# Patient Record
Sex: Female | Born: 1955 | Race: Black or African American | Hispanic: No | Marital: Single
Health system: Southern US, Community
[De-identification: ages and names within clinical notes are randomized; demographics above are authoritative.]

## PROBLEM LIST (undated history)

## (undated) DIAGNOSIS — H43391 Other vitreous opacities, right eye: Secondary | ICD-10-CM

## (undated) HISTORY — PX: BREAST EXCISIONAL BIOPSY: SUR124

---

## 2000-09-02 ENCOUNTER — Emergency Department (HOSPITAL_COMMUNITY): Admission: EM | Admit: 2000-09-02 | Discharge: 2000-09-02 | Payer: Self-pay | Admitting: Emergency Medicine

## 2003-03-20 ENCOUNTER — Emergency Department (HOSPITAL_COMMUNITY): Admission: EM | Admit: 2003-03-20 | Discharge: 2003-03-20 | Payer: Self-pay | Admitting: *Deleted

## 2004-01-30 ENCOUNTER — Emergency Department (HOSPITAL_COMMUNITY): Admission: AD | Admit: 2004-01-30 | Discharge: 2004-01-30 | Payer: Self-pay | Admitting: Family Medicine

## 2005-05-27 ENCOUNTER — Encounter: Admission: RE | Admit: 2005-05-27 | Discharge: 2005-05-27 | Payer: Self-pay | Admitting: Obstetrics and Gynecology

## 2006-05-28 ENCOUNTER — Encounter: Admission: RE | Admit: 2006-05-28 | Discharge: 2006-05-28 | Payer: Self-pay | Admitting: Obstetrics and Gynecology

## 2007-05-31 ENCOUNTER — Encounter: Admission: RE | Admit: 2007-05-31 | Discharge: 2007-05-31 | Payer: Self-pay | Admitting: Obstetrics and Gynecology

## 2008-08-09 ENCOUNTER — Encounter: Admission: RE | Admit: 2008-08-09 | Discharge: 2008-08-09 | Payer: Self-pay | Admitting: Obstetrics and Gynecology

## 2009-09-11 ENCOUNTER — Encounter: Admission: RE | Admit: 2009-09-11 | Discharge: 2009-09-11 | Payer: Self-pay | Admitting: Obstetrics and Gynecology

## 2012-05-11 ENCOUNTER — Other Ambulatory Visit: Payer: Self-pay | Admitting: Family Medicine

## 2012-05-11 ENCOUNTER — Other Ambulatory Visit (HOSPITAL_COMMUNITY)
Admission: RE | Admit: 2012-05-11 | Discharge: 2012-05-11 | Disposition: A | Discharge status: Home / Self Care | Payer: BC Managed Care – PPO | Source: Ambulatory Visit | Attending: Family Medicine | Admitting: Family Medicine

## 2012-05-11 DIAGNOSIS — Z1231 Encounter for screening mammogram for malignant neoplasm of breast: Secondary | ICD-10-CM

## 2012-05-11 DIAGNOSIS — Z0000 Encounter for general adult medical examination without abnormal findings: Secondary | ICD-10-CM | POA: Insufficient documentation

## 2012-05-11 DIAGNOSIS — Z780 Asymptomatic menopausal state: Secondary | ICD-10-CM

## 2012-06-21 ENCOUNTER — Ambulatory Visit
Admission: RE | Admit: 2012-06-21 | Discharge: 2012-06-21 | Disposition: A | Discharge status: Home / Self Care | Payer: BC Managed Care – PPO | Source: Ambulatory Visit | Attending: Family Medicine | Admitting: Family Medicine

## 2012-06-21 DIAGNOSIS — Z1231 Encounter for screening mammogram for malignant neoplasm of breast: Secondary | ICD-10-CM

## 2013-08-18 ENCOUNTER — Other Ambulatory Visit: Payer: Self-pay | Admitting: Family Medicine

## 2013-08-18 DIAGNOSIS — Z1231 Encounter for screening mammogram for malignant neoplasm of breast: Secondary | ICD-10-CM

## 2013-09-06 ENCOUNTER — Ambulatory Visit
Admission: RE | Admit: 2013-09-06 | Discharge: 2013-09-06 | Disposition: A | Discharge status: Home / Self Care | Payer: BC Managed Care – PPO | Source: Ambulatory Visit | Attending: Family Medicine | Admitting: Family Medicine

## 2013-09-06 DIAGNOSIS — Z1231 Encounter for screening mammogram for malignant neoplasm of breast: Secondary | ICD-10-CM

## 2014-08-21 ENCOUNTER — Other Ambulatory Visit: Payer: Self-pay

## 2014-08-21 DIAGNOSIS — Z1231 Encounter for screening mammogram for malignant neoplasm of breast: Secondary | ICD-10-CM

## 2014-09-12 ENCOUNTER — Ambulatory Visit
Admission: RE | Admit: 2014-09-12 | Discharge: 2014-09-12 | Disposition: A | Discharge status: Home / Self Care | Payer: BC Managed Care – PPO | Source: Ambulatory Visit

## 2014-09-12 DIAGNOSIS — Z1231 Encounter for screening mammogram for malignant neoplasm of breast: Secondary | ICD-10-CM

## 2015-11-19 ENCOUNTER — Other Ambulatory Visit: Payer: Self-pay | Admitting: Family Medicine

## 2015-11-19 ENCOUNTER — Other Ambulatory Visit (HOSPITAL_COMMUNITY)
Admission: RE | Admit: 2015-11-19 | Discharge: 2015-11-19 | Disposition: A | Discharge status: Home / Self Care | Payer: BC Managed Care – PPO | Source: Ambulatory Visit | Attending: Family Medicine | Admitting: Family Medicine

## 2015-11-19 DIAGNOSIS — Z01419 Encounter for gynecological examination (general) (routine) without abnormal findings: Secondary | ICD-10-CM | POA: Insufficient documentation

## 2015-11-19 DIAGNOSIS — Z1231 Encounter for screening mammogram for malignant neoplasm of breast: Secondary | ICD-10-CM

## 2015-11-19 DIAGNOSIS — E2839 Other primary ovarian failure: Secondary | ICD-10-CM

## 2015-11-21 LAB — CYTOLOGY - PAP

## 2016-01-06 ENCOUNTER — Ambulatory Visit
Admission: RE | Admit: 2016-01-06 | Discharge: 2016-01-06 | Disposition: A | Discharge status: Home / Self Care | Payer: BC Managed Care – PPO | Source: Ambulatory Visit | Attending: Family Medicine | Admitting: Family Medicine

## 2016-01-06 DIAGNOSIS — Z1231 Encounter for screening mammogram for malignant neoplasm of breast: Secondary | ICD-10-CM

## 2016-01-06 DIAGNOSIS — E2839 Other primary ovarian failure: Secondary | ICD-10-CM

## 2017-11-16 ENCOUNTER — Other Ambulatory Visit: Payer: Self-pay | Admitting: Family Medicine

## 2017-11-16 DIAGNOSIS — Z1231 Encounter for screening mammogram for malignant neoplasm of breast: Secondary | ICD-10-CM

## 2017-12-16 ENCOUNTER — Ambulatory Visit: Payer: BC Managed Care – PPO

## 2018-01-12 ENCOUNTER — Encounter: Payer: Self-pay | Admitting: Radiology

## 2018-01-12 ENCOUNTER — Ambulatory Visit
Admission: RE | Admit: 2018-01-12 | Discharge: 2018-01-12 | Disposition: A | Discharge status: Home / Self Care | Payer: BC Managed Care – PPO | Source: Ambulatory Visit | Attending: Family Medicine | Admitting: Family Medicine

## 2018-01-12 DIAGNOSIS — Z1231 Encounter for screening mammogram for malignant neoplasm of breast: Secondary | ICD-10-CM

## 2018-02-07 ENCOUNTER — Encounter: Payer: Self-pay | Admitting: Podiatry

## 2018-02-07 ENCOUNTER — Ambulatory Visit (INDEPENDENT_AMBULATORY_CARE_PROVIDER_SITE_OTHER): Payer: BC Managed Care – PPO

## 2018-02-07 ENCOUNTER — Ambulatory Visit: Payer: BC Managed Care – PPO | Admitting: Podiatry

## 2018-02-07 VITALS — BP 144/83 | HR 89

## 2018-02-07 DIAGNOSIS — M722 Plantar fascial fibromatosis: Secondary | ICD-10-CM

## 2018-02-07 MED ORDER — TRIAMCINOLONE ACETONIDE 10 MG/ML IJ SUSP: 10.0000 mg | Freq: Once | INTRAMUSCULAR | Status: AC

## 2018-02-07 NOTE — Patient Instructions (Signed)

## 2018-02-07 NOTE — Progress Notes (Signed)
Subjective:   Patient ID: Danielle Alexander, female   DOB: 62 y.o.   MRN: [id number]   HPI Patient presents stating she has had 2 months of intense discomfort plantar aspect right heel and did buy new shoes over the holidays and the pain started after this.  Patient states that it is worse when she gets up in the morning or after periods of sitting   Review of Systems  All other systems reviewed and are negative.       Objective:  Physical Exam  Constitutional: She appears well-developed and well-nourished.  Cardiovascular: Intact distal pulses.  Pulmonary/Chest: Effort normal.  Musculoskeletal: Normal range of motion.  Neurological: She is alert.  Skin: Skin is warm.  Nursing note and vitals reviewed.   Neurovascular status found to be intact muscle strength adequate range of motion within normal limits with patient found to have intense discomfort plantar fascia right at the insertional point of the tendon into the calcaneus with fluid buildup around the medial band.  Patient is noted to have good digital perfusion and is well oriented x3     Assessment:  Acute plantar fasciitis right with inflammation fluid around the medial band     Plan:  H&P conditions reviewed and injected the plantar fascia right 3 mg Kenalog 5 mg Xylocaine and applied fascial brace with instructions on usage.  Gave instructions on reduced activity and shoe gear modifications and gave instructions on appropriate at home physical therapy for this.  X-rays indicate small spur with no indications of stress fracture or arthritis

## 2018-02-16 ENCOUNTER — Telehealth: Payer: Self-pay | Admitting: Podiatry

## 2018-02-16 NOTE — Telephone Encounter (Signed)
I'm a pt of Dr. Beverlee Nimsegal's and he said he was [facility] My number is [phone]. I checked several days last week and the prescription was not there. Thank you.

## 2018-02-17 ENCOUNTER — Other Ambulatory Visit: Payer: Self-pay | Admitting: Family Medicine

## 2018-02-17 ENCOUNTER — Other Ambulatory Visit (HOSPITAL_COMMUNITY)
Admission: RE | Admit: 2018-02-17 | Discharge: 2018-02-17 | Disposition: A | Discharge status: Home / Self Care | Payer: BC Managed Care – PPO | Source: Ambulatory Visit | Attending: Family Medicine | Admitting: Family Medicine

## 2018-02-17 DIAGNOSIS — Z124 Encounter for screening for malignant neoplasm of cervix: Secondary | ICD-10-CM | POA: Diagnosis present

## 2018-02-18 LAB — CYTOLOGY - PAP
Adequacy: ABSENT
Diagnosis: NEGATIVE
HPV: NOT DETECTED

## 2018-02-21 ENCOUNTER — Ambulatory Visit (INDEPENDENT_AMBULATORY_CARE_PROVIDER_SITE_OTHER): Payer: BC Managed Care – PPO | Admitting: Podiatry

## 2018-02-21 ENCOUNTER — Encounter: Payer: Self-pay | Admitting: Podiatry

## 2018-02-21 DIAGNOSIS — M722 Plantar fascial fibromatosis: Secondary | ICD-10-CM

## 2018-02-21 NOTE — Progress Notes (Signed)
Subjective:   Patient ID: Danielle Alexander, female   DOB: 62 y.o.   MRN: [id number]   HPI Patient states that her heel has improved dramatically with minimal discomfort   ROS      Objective:  Physical Exam  Neurovascular status intact with patient right heel improved but still tender upon palpation     Assessment:  Plantar fasciitis right improving but present     Plan:  Instructed on continued physical therapy anti-inflammatories and supportive shoe.  Patient is discharged and will be seen back as needed

## 2019-04-27 ENCOUNTER — Other Ambulatory Visit: Payer: Self-pay | Admitting: Family Medicine

## 2019-06-06 ENCOUNTER — Other Ambulatory Visit: Payer: Self-pay | Admitting: Family Medicine

## 2019-06-06 DIAGNOSIS — Z1231 Encounter for screening mammogram for malignant neoplasm of breast: Secondary | ICD-10-CM

## 2019-06-06 DIAGNOSIS — M8580 Other specified disorders of bone density and structure, unspecified site: Secondary | ICD-10-CM

## 2019-06-06 DIAGNOSIS — E2839 Other primary ovarian failure: Secondary | ICD-10-CM

## 2019-07-25 ENCOUNTER — Ambulatory Visit
Admission: RE | Admit: 2019-07-25 | Discharge: 2019-07-25 | Disposition: A | Discharge status: Home / Self Care | Payer: BC Managed Care – PPO | Source: Ambulatory Visit | Attending: Family Medicine | Admitting: Family Medicine

## 2019-07-25 ENCOUNTER — Other Ambulatory Visit: Payer: Self-pay

## 2019-07-25 DIAGNOSIS — Z1231 Encounter for screening mammogram for malignant neoplasm of breast: Secondary | ICD-10-CM

## 2019-07-25 DIAGNOSIS — E2839 Other primary ovarian failure: Secondary | ICD-10-CM

## 2019-07-25 DIAGNOSIS — M8580 Other specified disorders of bone density and structure, unspecified site: Secondary | ICD-10-CM

## 2020-03-07 ENCOUNTER — Ambulatory Visit: Payer: BC Managed Care – PPO | Attending: Family

## 2020-03-07 DIAGNOSIS — Z23 Encounter for immunization: Secondary | ICD-10-CM

## 2020-03-07 NOTE — Progress Notes (Signed)
   Covid-19 Vaccination Clinic  Name:  Danielle Alexander    MRN: [id number] DOB: 1956/04/23  03/07/2020  Danielle Alexander was observed post Covid-19 immunization for 15 minutes without incident. She was provided with Vaccine Information Sheet and instruction to access the V-Safe system.   Danielle Alexander was instructed to call 911 with any severe reactions post vaccine: Marland Kitchen Difficulty breathing  . Swelling of face and throat  . A fast heartbeat  . A bad rash all over body  . Dizziness and weakness   Immunizations Administered    Name Date Dose VIS Date Route   Moderna COVID-19 Vaccine 03/07/2020 12:16 PM 0.5 mL 10/31/2019 Intramuscular   Manufacturer: Moderna   Lot: [id number]   NDC: 46270-350-09

## 2020-04-09 ENCOUNTER — Ambulatory Visit: Payer: BC Managed Care – PPO

## 2020-05-14 ENCOUNTER — Ambulatory Visit: Payer: BC Managed Care – PPO | Attending: Family

## 2020-05-14 DIAGNOSIS — Z23 Encounter for immunization: Secondary | ICD-10-CM

## 2020-05-14 NOTE — Progress Notes (Signed)
   Covid-19 Vaccination Clinic  Name:  Danielle Alexander    MRN: [id number] DOB: 27-Jul-1956  05/14/2020  [name] was observed post Covid-19 immunization for 15 minutes without incident. She was provided with Vaccine Information Sheet and instruction to access the V-Safe system.   [name] was instructed to call 911 with any severe reactions post vaccine: Marland Kitchen Difficulty breathing  . Swelling of face and throat  . A fast heartbeat  . A bad rash all over body  . Dizziness and weakness   Immunizations Administered    Name Date Dose VIS Date Route   Moderna COVID-19 Vaccine 05/14/2020  2:51 PM 0.5 mL 10/2019 Intramuscular   Manufacturer: Moderna   Lot: [id number]   NDC: 70449-252-41

## 2020-11-19 ENCOUNTER — Ambulatory Visit: Payer: BC Managed Care – PPO | Attending: Family

## 2020-11-19 DIAGNOSIS — Z23 Encounter for immunization: Secondary | ICD-10-CM

## 2021-03-10 ENCOUNTER — Other Ambulatory Visit: Payer: Self-pay | Admitting: Family Medicine

## 2021-03-10 DIAGNOSIS — Z1231 Encounter for screening mammogram for malignant neoplasm of breast: Secondary | ICD-10-CM

## 2021-03-13 ENCOUNTER — Other Ambulatory Visit: Payer: Self-pay

## 2021-03-13 ENCOUNTER — Ambulatory Visit (INDEPENDENT_AMBULATORY_CARE_PROVIDER_SITE_OTHER): Payer: BC Managed Care – PPO | Admitting: Ophthalmology

## 2021-03-13 ENCOUNTER — Encounter (INDEPENDENT_AMBULATORY_CARE_PROVIDER_SITE_OTHER): Payer: Self-pay | Admitting: Ophthalmology

## 2021-03-13 DIAGNOSIS — H4311 Vitreous hemorrhage, right eye: Secondary | ICD-10-CM | POA: Diagnosis not present

## 2021-03-13 DIAGNOSIS — H35011 Changes in retinal vascular appearance, right eye: Secondary | ICD-10-CM | POA: Diagnosis not present

## 2021-03-13 MED ORDER — PREDNISOLONE ACETATE 1 % OP SUSP: 1.0000 [drp] | Freq: Four times a day (QID) | OPHTHALMIC | 0 refills | Status: AC

## 2021-03-13 MED ORDER — OFLOXACIN 0.3 % OP SOLN: 1.0000 [drp] | Freq: Four times a day (QID) | OPHTHALMIC | 0 refills | Status: AC

## 2021-03-13 NOTE — Assessment & Plan Note (Addendum)
1.  GIVE PREOPERATIVE EYE MEDICATION TO PATIENT TO TAKE HOME, AND DO NOT USE UNTIL POSTOPERATIVE VISIT THE NEXT DAY IN DR [facility] OFFICE UNLESS INSTRUCTED BY PHYSICIAN TO START THE NEXT DAY.   POSITIONS:  (if gas bubble placed into the eye)  The patient understands the risks of anesthesia including but not limited to the rare occurrence of death. The patient also understands risks and benefits of the planned surgical procedure include but are not limited to hemorrhage, infection, scarring, loss of vision, progression of disease despite intervention, and need for another procedure. The patient understands the risks of anesthesia including but not limited to the rare occurrence of death. The patient also understands risks and benefits of the planned surgical procedure include but are not limited to hemorrhage, infection, scarring, loss of vision, progression of disease despite intervention, and need for another procedure.No retinal tear or detachment on bscan u/s.  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  Nonetheless vitrectomy to clear the visual axis, and to confirm the etiology is mandatory in my opinion so as to treat any underlying neovascular disease that may have occurred from an occult retinal vein occlusion with secondary neovascular disease.  This would allow for delivery of focal laser treatment to halt progression of hemorrhage

## 2021-03-13 NOTE — Progress Notes (Signed)
03/13/2021     CHIEF COMPLAINT Patient presents for Retina Evaluation (ENP- WI- VIT HEM OD.- Ref by Shapiro//Pt states, "On Monday I started noticing what I thought was a floater OD. It looked like a string. It just keeps getting bigger.")   HISTORY OF PRESENT ILLNESS: Danielle Alexander is a 65 y.o. female who presents to the clinic today for:   HPI    Retina Evaluation    In right eye.  This started 4 days ago.  Duration of 4 days.  Associated Symptoms Floaters and Distortion (My VA OD looks like looking "through a screen door."). Additional comments: ENP- WI- VIT HEM OD.- Ref by Danielle Alexander  Pt states, "On Monday I started noticing what I thought was a floater OD. It looked like a string. It just keeps getting bigger."       Last edited by Danielle Alexander, COA on 03/13/2021  4:08 PM. (History)      Referring physician: Soundra Pilon, FNP [address] [place],  [place] [address]  HISTORICAL INFORMATION:   Selected notes from the MEDICAL RECORD NUMBER       CURRENT MEDICATIONS: No current outpatient medications on file. (Ophthalmic Drugs)   No current facility-administered medications for this visit. (Ophthalmic Drugs)   No current outpatient medications on file. (Other)   Current Facility-Administered Medications (Other)  Medication Route  . triamcinolone acetonide (KENALOG) 10 MG/ML injection 10 mg Other      REVIEW OF SYSTEMS:    ALLERGIES No Known Allergies  PAST MEDICAL HISTORY History reviewed. No pertinent past medical history. History reviewed. No pertinent surgical history.  FAMILY HISTORY Family History  Problem Relation Age of Onset  . Breast cancer Maternal Aunt 58    SOCIAL HISTORY Social History   Tobacco Use  . Smoking status: Unknown If Ever Smoked  . Smokeless tobacco: Never Used         OPHTHALMIC EXAM:  Base Eye Exam    Visual Acuity (ETDRS)      Right Left   Dist cc CF at 3' 20/25 +2   Dist ph cc NI    Correction:  Glasses       Tonometry (Tonopen, 4:11 PM)      Right Left   Pressure 14 14       Pupils      Dark Light Shape React   Right 7 7 Round DILATED   Left 7 7 Round DILATED       Extraocular Movement      Right Left    Full Full       Neuro/Psych    Oriented x3: Yes       Dilation    Right eye: 1.0% Mydriacyl, 2.5% Phenylephrine @ 4:11 PM        Slit Lamp and Fundus Exam    External Exam      Right Left   External Normal Normal       Slit Lamp Exam      Right Left   Lids/Lashes Normal Normal   Conjunctiva/Sclera White and quiet White and quiet   Cornea Clear Clear   Anterior Chamber Deep and quiet Deep and quiet   Iris Round and reactive Round and reactive   Lens 1+ Nuclear sclerosis 1+ Nuclear sclerosis   Anterior Vitreous Normal Normal       Fundus Exam      Right Left   Posterior Vitreous Vitreous hemorrhage 3+ Normal   Disc no  details Normal   C/D Ratio  0.35   Macula no details Normal   Vessels  Normal   Periphery Pre retinal heme nasally at 9, ?NVE Normal          IMAGING AND PROCEDURES  Imaging and Procedures for 03/13/21  Color Fundus Photography Optos - OU - Both Eyes       Right Eye Progression has no prior data.   Left Eye Progression has no prior data. Disc findings include normal observations. Macula : normal observations. Vessels : normal observations. Periphery : normal observations.   Notes Dense preretinal vitreous hemorrhage right eye, no real details of the macula or the nerve       B-Scan Ultrasound - OD - Right Eye       Quality was good. Findings included vitreous hemorrhage, vitreous opacities.   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  GIVE PREOPERATIVE  EYE MEDICATION TO PATIENT TO TAKE HOME, AND DO NOT USE UNTIL POSTOPERATIVE VISIT THE NEXT DAY IN DR [facility] OFFICE UNLESS INSTRUCTED BY PHYSICIAN TO START THE NEXT DAY.   POSITIONS:  (if gas bubble placed into the eye)  The patient understands the risks of anesthesia including but not limited to the rare occurrence of death. The patient also understands risks and benefits of the planned surgical procedure include but are not limited to hemorrhage, infection, scarring, loss of vision, progression of disease despite intervention, and need for another procedure. The patient understands the risks of anesthesia including but not limited to the rare occurrence of death. The patient also understands risks and benefits of the planned surgical procedure include but are not limited to hemorrhage, infection, scarring, loss of vision, progression of disease despite intervention, and need for another procedure.No retinal tear or detachment on bscan u/s.  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  Nonetheless vitrectomy to clear the visual axis, and to confirm the etiology is mandatory in my opinion so as to treat any underlying neovascular disease that may have occurred from an occult retinal vein occlusion with secondary neovascular disease.  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      ICD-10-CM   1. Vitreous hemorrhage, right eye (HCC)  H43.11 B-Scan Ultrasound - OD - Right Eye  2. Vitreous hemorrhage of right eye (HCC)  H43.11 Color Fundus Photography Optos - OU - Both  Eyes  3. 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  After lengthy discussion, I do recommend surgical intervention right eye to clear the visual axis, recover visual acuity and confirm for certainty the etiology of this hemorrhage.  2.  Do encourage patient to schedule follow-up with her primary medical doctor for control and evaluation of what may be hypertension and  secondary hypertensive retinopathy with a retinal artery macro aneurysm suspected to hemorrhage in this case  3.  Ophthalmic Meds Ordered this visit:  No orders of the defined types were placed in this encounter.      No follow-ups on file.  There are no Patient Instructions on file for this visit.   Explained the diagnoses, plan, and follow up with the patient and they expressed understanding.  Patient expressed understanding of the importance of proper follow up care.   Danielle Alexander M.D. Diseases & Surgery of the Retina and Vitreous [facility] 03/13/21     Abbreviations: M myopia (nearsighted); A astigmatism; H hyperopia (farsighted); P presbyopia; Mrx spectacle prescription;  CTL contact lenses; OD right eye; OS left eye; OU both eyes  XT exotropia; ET esotropia; PEK punctate epithelial keratitis; PEE punctate epithelial erosions; DES dry eye syndrome; MGD meibomian gland dysfunction; ATs artificial tears; PFAT's preservative free artificial tears; NSC nuclear sclerotic cataract; PSC posterior subcapsular cataract; ERM epi-retinal membrane; PVD posterior vitreous detachment; RD retinal detachment; DM diabetes mellitus; DR diabetic retinopathy; NPDR non-proliferative diabetic retinopathy; PDR proliferative diabetic retinopathy; CSME clinically significant macular edema; DME diabetic macular edema; dbh dot blot hemorrhages; CWS cotton wool spot; POAG primary open angle glaucoma; C/D cup-to-disc ratio; HVF humphrey visual field; GVF goldmann visual field; OCT  optical coherence tomography; IOP intraocular pressure; BRVO Branch retinal vein occlusion; CRVO central retinal vein occlusion; CRAO central retinal artery occlusion; BRAO branch retinal artery occlusion; RT retinal tear; SB scleral buckle; PPV pars plana vitrectomy; VH Vitreous hemorrhage; PRP panretinal laser photocoagulation; IVK intravitreal kenalog; VMT vitreomacular traction; MH Macular hole;  NVD neovascularization of the disc; NVE neovascularization elsewhere; AREDS age related eye disease study; ARMD age related macular degeneration; POAG primary open angle glaucoma; EBMD epithelial/anterior basement membrane dystrophy; ACIOL anterior chamber intraocular lens; IOL intraocular lens; PCIOL posterior chamber intraocular lens; Phaco/IOL phacoemulsification with intraocular lens placement; PRK photorefractive keratectomy; LASIK laser assisted in situ keratomileusis; HTN hypertension; DM diabetes mellitus; COPD chronic obstructive pulmonary disease

## 2021-03-13 NOTE — Assessment & Plan Note (Signed)
This condition is suspected and patient has not a history of known hypertension nonetheless she had recent blood pressure of 156 over mid 90s in the dentist office and again today blood pressure checked patient is 166/94 right arm.

## 2021-03-19 ENCOUNTER — Ambulatory Visit (INDEPENDENT_AMBULATORY_CARE_PROVIDER_SITE_OTHER): Payer: BC Managed Care – PPO | Admitting: Ophthalmology

## 2021-03-19 ENCOUNTER — Encounter (INDEPENDENT_AMBULATORY_CARE_PROVIDER_SITE_OTHER): Payer: Self-pay | Admitting: Ophthalmology

## 2021-03-19 ENCOUNTER — Other Ambulatory Visit: Payer: Self-pay

## 2021-03-19 DIAGNOSIS — H35011 Changes in retinal vascular appearance, right eye: Secondary | ICD-10-CM

## 2021-03-19 DIAGNOSIS — H4311 Vitreous hemorrhage, right eye: Secondary | ICD-10-CM | POA: Diagnosis not present

## 2021-03-19 NOTE — Assessment & Plan Note (Signed)
No worsening on the subsequent last week since last visit.  Patient will continue with upright positioning throughout the day but particularly at night to allow gravity to further clear this diffuse hemorrhage.  I explained the patient that should she need more rapid clearance of this media in order for visual acuity recovery, surgical intervention would be straightforward nonetheless major eye surgery to clear out the vitreous hemorrhage, and replace it with clear material.

## 2021-03-19 NOTE — Assessment & Plan Note (Signed)
But still appears to be the finding inferotemporally by B-scan ultrasonography

## 2021-03-19 NOTE — Progress Notes (Signed)
03/19/2021     CHIEF COMPLAINT Patient presents for Retina Follow Up (FU OD- Pt cancelled sx 03/14/2021//Pt states, "My va seems just about the same. I cannot tell any difference.")   HISTORY OF PRESENT ILLNESS: Danielle Alexander is a 65 y.o. female who presents to the clinic today for:   HPI    Retina Follow Up    Patient presents with  Other.  In right eye.  This started 6 days ago.  Duration of 6 days.  Since onset it is stable. Additional comments: FU OD- Pt cancelled sx 03/14/2021  Pt states, "My va seems just about the same. I cannot tell any difference."       Last edited by Demetrios Loll, COA on 03/19/2021  8:50 AM. (History)      Referring physician: Soundra Pilon, FNP [address] [place],  [place] [address]  HISTORICAL INFORMATION:   Selected notes from the MEDICAL RECORD NUMBER       CURRENT MEDICATIONS: Current Outpatient Medications (Ophthalmic Drugs)  Medication Sig  . ofloxacin (OCUFLOX) 0.3 % ophthalmic solution Place 1 drop into the right eye 4 (four) times daily for 21 days.  . prednisoLONE acetate (PRED FORTE) 1 % ophthalmic suspension Place 1 drop into the right eye 4 (four) times daily for 21 days.   No current facility-administered medications for this visit. (Ophthalmic Drugs)   No current outpatient medications on file. (Other)   Current Facility-Administered Medications (Other)  Medication Route  . triamcinolone acetonide (KENALOG) 10 MG/ML injection 10 mg Other      REVIEW OF SYSTEMS:    ALLERGIES No Known Allergies  PAST MEDICAL HISTORY History reviewed. No pertinent past medical history. History reviewed. No pertinent surgical history.  FAMILY HISTORY Family History  Problem Relation Age of Onset  . Breast cancer Maternal Aunt 56    SOCIAL HISTORY Social History   Tobacco Use  . Smoking status: Unknown If Ever Smoked  . Smokeless tobacco: Never Used         OPHTHALMIC EXAM:  Base Eye Exam    Visual  Acuity (ETDRS)      Right Left   Dist cc CF at 3' 20/20   Dist ph cc NI    Correction: Glasses       Tonometry (Tonopen, 8:53 AM)      Right Left   Pressure 14 16       Pupils      Pupils Dark Light Shape React APD   Right PERRL 4 3 Round Brisk None   Left PERRL 4 3 Round Brisk None       Extraocular Movement      Right Left    Full Full       Neuro/Psych    Oriented x3: Yes       Dilation    Right eye: 1.0% Mydriacyl, 2.5% Phenylephrine @ 8:52 AM        Slit Lamp and Fundus Exam    External Exam      Right Left   External Normal Normal       Slit Lamp Exam      Right Left   Lids/Lashes Normal Normal   Conjunctiva/Sclera White and quiet White and quiet   Cornea Clear Clear   Anterior Chamber Deep and quiet Deep and quiet   Iris Round and reactive Round and reactive   Lens 1+ Nuclear sclerosis 1+ Nuclear sclerosis   Anterior Vitreous Normal Normal  Fundus Exam      Right Left   Posterior Vitreous Vitreous hemorrhage 3+    Disc no details    Macula no details    Periphery Pre retinal heme nasally at 9, ?NVE           IMAGING AND PROCEDURES  Imaging and Procedures for 03/19/21  Color Fundus Photography Optos - OU - Both Eyes       Right Eye Progression has no prior data.   Left Eye Progression has no prior data. Disc findings include normal observations. Macula : normal observations. Vessels : normal observations. Periphery : normal observations.   Notes Dense preretinal vitreous hemorrhage right eye, no real details of the macula or the nerve  More dispersed vitreous hemorrhage, overall no change       B-Scan Ultrasound - OD - Right Eye       Quality was good. Findings included vitreous hemorrhage, vitreous opacities.   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  Patient will continue with upright positioning throughout the day but particularly at night to allow gravity to further clear this diffuse hemorrhage.  I explained the patient that should she need more rapid clearance of this media in order for visual acuity recovery, surgical intervention would be straightforward nonetheless major eye surgery to clear out the vitreous hemorrhage, and replace it with clear material.  Retinal macroaneurysm of right eye But still appears to be the finding inferotemporally by B-scan ultrasonography      ICD-10-CM   1. Vitreous hemorrhage of right eye (HCC)  H43.11 Color Fundus Photography Optos - OU - Both Eyes    B-Scan Ultrasound - OD - Right Eye  2. Retinal macroaneurysm of right eye  H35.011     1.  We will continue to observe  2.  3.  Ophthalmic Meds Ordered this visit:  No orders of the defined types were placed in this encounter.      Return in about 2 weeks (around 04/02/2021) for dilate, OD.  There are no Patient Instructions on file for this visit.   Explained the diagnoses, plan, and follow up with the patient and they expressed understanding.  Patient expressed understanding of the importance of proper follow up care.   Alford Highland Kaylaann Mountz M.D. Diseases & Surgery of the Retina and Vitreous [facility] 03/19/21     Abbreviations: M myopia (nearsighted); A astigmatism; H hyperopia (farsighted); P presbyopia; Mrx spectacle prescription;  CTL contact lenses; OD right eye; OS left eye; OU both eyes  XT exotropia; ET esotropia; PEK punctate epithelial keratitis; PEE punctate epithelial erosions; DES dry eye syndrome; MGD meibomian gland dysfunction; ATs artificial tears; PFAT's  preservative free artificial tears; NSC nuclear sclerotic cataract; PSC posterior subcapsular cataract; ERM epi-retinal membrane; PVD posterior vitreous detachment; RD retinal detachment; DM diabetes mellitus; DR diabetic retinopathy; NPDR non-proliferative diabetic retinopathy; PDR proliferative diabetic retinopathy; CSME clinically significant macular edema; DME diabetic macular edema; dbh dot blot hemorrhages; CWS cotton wool spot; POAG primary open angle glaucoma; C/D cup-to-disc ratio; HVF humphrey visual  field; GVF goldmann visual field; OCT optical coherence tomography; IOP intraocular pressure; BRVO Branch retinal vein occlusion; CRVO central retinal vein occlusion; CRAO central retinal artery occlusion; BRAO branch retinal artery occlusion; RT retinal tear; SB scleral buckle; PPV pars plana vitrectomy; VH Vitreous hemorrhage; PRP panretinal laser photocoagulation; IVK intravitreal kenalog; VMT vitreomacular traction; MH Macular hole;  NVD neovascularization of the disc; NVE neovascularization elsewhere; AREDS age related eye disease study; ARMD age related macular degeneration; POAG primary open angle glaucoma; EBMD epithelial/anterior basement membrane dystrophy; ACIOL anterior chamber intraocular lens; IOL intraocular lens; PCIOL posterior chamber intraocular lens; Phaco/IOL phacoemulsification with intraocular lens placement; [place] photorefractive keratectomy; LASIK laser assisted in situ keratomileusis; HTN hypertension; DM diabetes mellitus; COPD chronic obstructive pulmonary disease

## 2021-03-25 ENCOUNTER — Encounter (INDEPENDENT_AMBULATORY_CARE_PROVIDER_SITE_OTHER): Payer: Self-pay

## 2021-03-26 NOTE — Progress Notes (Signed)
   Covid-19 Vaccination Clinic  Name:  Danielle Alexander    MRN: [id number] DOB: June 30, 1956  03/26/2021  Danielle Alexander was observed post Covid-19 immunization for 15 minutes without incident. She was provided with Vaccine Information Sheet and instruction to access the V-Safe system.   Danielle Alexander was instructed to call 911 with any severe reactions post vaccine: Marland Kitchen Difficulty breathing  . Swelling of face and throat  . A fast heartbeat  . A bad rash all over body  . Dizziness and weakness   Immunizations Administered    Name Date Dose VIS Date Route   Moderna Covid-19 Booster Vaccine 11/19/2020 10:15 AM 0.25 mL 09/18/2020 Intramuscular   Manufacturer: Moderna   Lot: [id number]   NDC: 66063-016-01

## 2021-04-01 ENCOUNTER — Encounter (INDEPENDENT_AMBULATORY_CARE_PROVIDER_SITE_OTHER): Payer: BC Managed Care – PPO | Admitting: Ophthalmology

## 2021-04-02 ENCOUNTER — Ambulatory Visit (INDEPENDENT_AMBULATORY_CARE_PROVIDER_SITE_OTHER): Payer: BC Managed Care – PPO | Admitting: Ophthalmology

## 2021-04-02 ENCOUNTER — Encounter (INDEPENDENT_AMBULATORY_CARE_PROVIDER_SITE_OTHER): Payer: Self-pay | Admitting: Ophthalmology

## 2021-04-02 ENCOUNTER — Other Ambulatory Visit: Payer: Self-pay

## 2021-04-02 DIAGNOSIS — H35011 Changes in retinal vascular appearance, right eye: Secondary | ICD-10-CM

## 2021-04-02 DIAGNOSIS — H4311 Vitreous hemorrhage, right eye: Secondary | ICD-10-CM

## 2021-04-02 DIAGNOSIS — H2513 Age-related nuclear cataract, bilateral: Secondary | ICD-10-CM | POA: Insufficient documentation

## 2021-04-02 NOTE — Assessment & Plan Note (Signed)
Likely cause of preretinal and dense vitreous hemorrhage right eye, I have reassured the patient that this is likely to close spontaneously and not likely to bleed again yet she should continue to monitor her blood pressure and seek medical evaluation if it is elevated to confirm control

## 2021-04-02 NOTE — Patient Instructions (Signed)
Patient instructed to contact the office promptly for profound worsening of vision.

## 2021-04-02 NOTE — Assessment & Plan Note (Signed)
Slight clearing as of today some 4 weeks post onset of hemorrhage, likely from retinal artery macro aneurysm.  No signs of new hemorrhage.  Slight clearance and now visual acuity on the eye chart we will continue to observe

## 2021-04-02 NOTE — Assessment & Plan Note (Signed)
Minor lens opacities overall will observe

## 2021-04-02 NOTE — Progress Notes (Signed)
04/02/2021     CHIEF COMPLAINT Patient presents for Retina Follow Up (2wk fu OD/Pt states, " I do not feel like anything has changed. I still cannot see well out of my OD.")   HISTORY OF PRESENT ILLNESS: Danielle Alexander is a 65 y.o. female who presents to the clinic today for:   HPI    Retina Follow Up    Diagnosis: Other   Laterality: right eye   Onset: 2 weeks ago   Severity: mild   Duration: 2 weeks   Comments: 2wk fu OD Pt states, " I do not feel like anything has changed. I still cannot see well out of my OD."       Last edited by Demetrios Loll, COA on 04/02/2021  3:07 PM. (History)      Referring physician: Soundra Pilon, FNP [address] [place],  [place] [address]  HISTORICAL INFORMATION:   Selected notes from the MEDICAL RECORD NUMBER       CURRENT MEDICATIONS: Current Outpatient Medications (Ophthalmic Drugs)  Medication Sig  . ofloxacin (OCUFLOX) 0.3 % ophthalmic solution Place 1 drop into the right eye 4 (four) times daily for 21 days.  . prednisoLONE acetate (PRED FORTE) 1 % ophthalmic suspension Place 1 drop into the right eye 4 (four) times daily for 21 days.   No current facility-administered medications for this visit. (Ophthalmic Drugs)   No current outpatient medications on file. (Other)   Current Facility-Administered Medications (Other)  Medication Route  . triamcinolone acetonide (KENALOG) 10 MG/ML injection 10 mg Other      REVIEW OF SYSTEMS:    ALLERGIES No Known Allergies  PAST MEDICAL HISTORY History reviewed. No pertinent past medical history. History reviewed. No pertinent surgical history.  FAMILY HISTORY Family History  Problem Relation Age of Onset  . Breast cancer Maternal Aunt 25    SOCIAL HISTORY Social History   Tobacco Use  . Smoking status: Unknown If Ever Smoked  . Smokeless tobacco: Never Used         OPHTHALMIC EXAM:  Base Eye Exam    Visual Acuity (ETDRS)      Right Left   Dist cc  20/100 20/20   Dist ph cc 20/80    Correction: Glasses       Tonometry (Tonopen, 3:09 PM)      Right Left   Pressure 15 15       Pupils      Pupils Dark Light Shape React APD   Right PERRL 4 3 Round Brisk None   Left PERRL 4 3 Round Brisk None       Neuro/Psych    Oriented x3: Yes       Dilation    Right eye: 1.0% Mydriacyl, 2.5% Phenylephrine @ 3:09 PM        Slit Lamp and Fundus Exam    External Exam      Right Left   External Normal Normal       Slit Lamp Exam      Right Left   Lids/Lashes Normal Normal   Conjunctiva/Sclera White and quiet White and quiet   Cornea Clear Clear   Anterior Chamber Deep and quiet Deep and quiet   Iris Round and reactive Round and reactive   Lens 2+ Nuclear sclerosis 1+ Nuclear sclerosis   Anterior Vitreous 2+ cells, old white clastic RBCs Normal       Fundus Exam      Right Left  Posterior Vitreous Vitreous hemorrhage 2+,     Disc Now glimpse of the nerve    Macula no details    Vessels No details    Periphery Dense vitreous dispersed hemorrhage, mostly old, no details peripherally           IMAGING AND PROCEDURES  Imaging and Procedures for 04/02/21  Color Fundus Photography Optos - OU - Both Eyes       Right Eye Progression has improved. Disc findings include normal observations.   Left Eye Progression has no prior data. Disc findings include normal observations. Macula : normal observations. Vessels : normal observations. Periphery : normal observations.   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  No signs of new hemorrhage.  Slight clearance and now visual acuity on the eye chart we will continue to observe  Cataract, nuclear  sclerotic, both eyes Minor lens opacities overall will observe  Retinal macroaneurysm of right eye Likely cause of preretinal and dense vitreous hemorrhage right eye, I have reassured the patient that this is likely to close spontaneously and not likely to bleed again yet she should continue to monitor her blood pressure and seek medical evaluation if it is elevated to confirm control      ICD-10-CM   1. Vitreous hemorrhage of right eye (HCC)  H43.11 Color Fundus Photography Optos - OU - Both Eyes  2. Cataract, nuclear sclerotic, both eyes  H25.13   3. Retinal macroaneurysm of right eye  H35.011     1.  2.  3.  Ophthalmic Meds Ordered this visit:  No orders of the defined types were placed in this encounter.      Return in about 4 weeks (around 04/30/2021) for dilate, OD, B-Scan U/S, COLOR FP.  Patient Instructions  Patient instructed to contact the office promptly for profound worsening of vision.    Explained the diagnoses, plan, and follow up with the patient and they expressed understanding.  Patient expressed understanding of the importance of proper follow up care.   Alford Highland Elmus Mathes M.D. Diseases & Surgery of the Retina and Vitreous [facility] 04/02/21     Abbreviations: M myopia (nearsighted); A astigmatism; H hyperopia (farsighted); P presbyopia; Mrx spectacle prescription;  CTL contact lenses; OD right eye; OS left eye; OU both eyes  XT exotropia; ET esotropia; PEK punctate epithelial keratitis; PEE punctate epithelial erosions; DES dry eye syndrome; MGD meibomian gland dysfunction; ATs artificial tears; PFAT's preservative free artificial tears; NSC nuclear sclerotic cataract; PSC posterior subcapsular cataract; ERM epi-retinal membrane; PVD posterior vitreous detachment; RD retinal detachment; DM diabetes mellitus; DR diabetic retinopathy; NPDR non-proliferative diabetic retinopathy; PDR proliferative diabetic retinopathy; CSME clinically  significant macular edema; DME diabetic macular edema; dbh dot blot hemorrhages; CWS cotton wool spot; POAG primary open angle glaucoma; C/D cup-to-disc ratio; HVF humphrey visual field; GVF goldmann visual field; OCT optical coherence tomography; IOP intraocular pressure; BRVO Branch retinal vein occlusion; CRVO central retinal vein occlusion; CRAO central retinal artery occlusion; BRAO branch retinal artery occlusion; RT retinal tear; SB scleral buckle; PPV pars plana vitrectomy; VH Vitreous hemorrhage; PRP panretinal laser photocoagulation; IVK intravitreal kenalog; VMT vitreomacular traction; MH Macular hole;  NVD neovascularization of the disc; NVE neovascularization elsewhere; AREDS age related eye disease study; ARMD age related macular degeneration; POAG primary open angle glaucoma; EBMD epithelial/anterior basement membrane dystrophy; ACIOL anterior chamber intraocular lens; IOL intraocular lens; PCIOL posterior chamber intraocular lens; Phaco/IOL phacoemulsification with intraocular lens placement; [place] photorefractive keratectomy; LASIK laser assisted in situ keratomileusis; HTN hypertension; DM diabetes mellitus; COPD chronic obstructive pulmonary disease

## 2021-05-01 ENCOUNTER — Ambulatory Visit
Admission: RE | Admit: 2021-05-01 | Discharge: 2021-05-01 | Disposition: A | Discharge status: Home / Self Care | Payer: BC Managed Care – PPO | Source: Ambulatory Visit | Attending: Family Medicine | Admitting: Family Medicine

## 2021-05-01 ENCOUNTER — Other Ambulatory Visit: Payer: Self-pay

## 2021-05-01 DIAGNOSIS — Z1231 Encounter for screening mammogram for malignant neoplasm of breast: Secondary | ICD-10-CM

## 2021-05-05 ENCOUNTER — Ambulatory Visit (INDEPENDENT_AMBULATORY_CARE_PROVIDER_SITE_OTHER): Payer: BC Managed Care – PPO | Admitting: Ophthalmology

## 2021-05-05 ENCOUNTER — Other Ambulatory Visit: Payer: Self-pay

## 2021-05-05 ENCOUNTER — Encounter (INDEPENDENT_AMBULATORY_CARE_PROVIDER_SITE_OTHER): Payer: Self-pay | Admitting: Ophthalmology

## 2021-05-05 DIAGNOSIS — H4311 Vitreous hemorrhage, right eye: Secondary | ICD-10-CM | POA: Diagnosis not present

## 2021-05-05 NOTE — Progress Notes (Signed)
05/05/2021     CHIEF COMPLAINT Patient presents for Retina Follow Up (5 Wk F/U OD, FP, b-scan OD//Pt sts, "I can't tell if it's clearing up, or if I'm getting used to it" OD. Pt denies ocular pain. No new floaters or flashes reported. VA OS stable.)   HISTORY OF PRESENT ILLNESS: Danielle Alexander is a 65 y.o. female who presents to the clinic today for:   HPI    Retina Follow Up    Diagnosis: Other   Laterality: right eye   Onset: 5 weeks ago   Severity: moderate   Duration: 5 weeks   Course: stable   Comments: 5 Wk F/U OD, FP, b-scan OD  Pt sts, "I can't tell if it's clearing up, or if I'm getting used to it" OD. Pt denies ocular pain. No new floaters or flashes reported. VA OS stable.       Last edited by Ileana Roup, COA on 05/05/2021  2:53 PM. (History)      Referring physician: Soundra Pilon, FNP [address] [place],  [place] [address]  HISTORICAL INFORMATION:   Selected notes from the MEDICAL RECORD NUMBER       CURRENT MEDICATIONS: No current outpatient medications on file. (Ophthalmic Drugs)   No current facility-administered medications for this visit. (Ophthalmic Drugs)   No current outpatient medications on file. (Other)   Current Facility-Administered Medications (Other)  Medication Route  . triamcinolone acetonide (KENALOG) 10 MG/ML injection 10 mg Other      REVIEW OF SYSTEMS:    ALLERGIES No Known Allergies  PAST MEDICAL HISTORY History reviewed. No pertinent past medical history. History reviewed. No pertinent surgical history.  FAMILY HISTORY Family History  Problem Relation Age of Onset  . Breast cancer Maternal Aunt 31    SOCIAL HISTORY Social History   Tobacco Use  . Smoking status: Unknown If Ever Smoked  . Smokeless tobacco: Never Used         OPHTHALMIC EXAM: Base Eye Exam    Visual Acuity (ETDRS)      Right Left   Dist cc 20/70 -1 20/20 -2   Dist ph cc 20/40    Correction: Glasses       Tonometry  (Tonopen, 2:57 PM)      Right Left   Pressure 16 17       Pupils      Pupils Dark Light Shape React APD   Right PERRL 4 3 Round Brisk None   Left PERRL 4 3 Round Brisk None       Visual Fields (Counting fingers)      Left Right    Full Full       Extraocular Movement      Right Left    Full Full       Neuro/Psych    Oriented x3: Yes   Mood/Affect: Normal       Dilation    Right eye: 1.0% Mydriacyl, 2.5% Phenylephrine @ 2:57 PM        Slit Lamp and Fundus Exam    External Exam      Right Left   External Normal Normal       Slit Lamp Exam      Right Left   Lids/Lashes Normal Normal   Conjunctiva/Sclera White and quiet White and quiet   Cornea Clear Clear   Anterior Chamber Deep and quiet Deep and quiet   Iris Round and reactive Round and reactive   Lens 2+  Nuclear sclerosis 1+ Nuclear sclerosis   Anterior Vitreous 2+ cells, old white clastic RBCs Normal       Fundus Exam      Right Left   Posterior Vitreous Vitreous hemorrhage 2+ centrally, no new hemorrhage     Disc Normal    C/D Ratio 0.3    Macula poor details, attached    Vessels Normal, no visible RAM    Periphery Dense vitreous dispersed hemorrhage, mostly old, no details peripherally           IMAGING AND PROCEDURES  Imaging and Procedures for 05/05/21  Color Fundus Photography Optos - OU - Both Eyes       Right Eye Progression has improved. Disc findings include normal observations.   Left Eye Progression has no prior data. Disc findings include normal observations. Macula : normal observations. Vessels : normal observations. Periphery : normal observations.   Notes Poor details of vessels and of periphery yet first glimpse of the optic nerve through the dispersed old vitreous hemorrhage OD, yet good view of nerve.  No obvious ram visualized, no retinal breaks visualized peripherally                  ASSESSMENT/PLAN:  Vitreous hemorrhage of right eye (HCC) OD, no new  vitreous hemorrhage in rather significant vitreous hemorrhage slowly clearing now over the last 2 months or so.  We will continue to monitor.  Patient is tolerating this gradual in and clearance of the hemorrhage with improving acuity slowly.    We will continue to observe      ICD-10-CM   1. Vitreous hemorrhage of right eye (HCC)  H43.11 Color Fundus Photography Optos - OU - Both Eyes    1.  Patient instructed to promptly notify the office of new onset visual acuity decline distortion or profound worsening of vision or profound worsening of current floaters. 2.  3.  Ophthalmic Meds Ordered this visit:  No orders of the defined types were placed in this encounter.      Return in about 2 months (around 07/05/2021) for dilate, OD, COLOR FP.  There are no Patient Instructions on file for this visit.   Explained the diagnoses, plan, and follow up with the patient and they expressed understanding.  Patient expressed understanding of the importance of proper follow up care.   Alford Highland Sterlin Knightly M.D. Diseases & Surgery of the Retina and Vitreous [facility] 05/05/21     Abbreviations: M myopia (nearsighted); A astigmatism; H hyperopia (farsighted); P presbyopia; Mrx spectacle prescription;  CTL contact lenses; OD right eye; OS left eye; OU both eyes  XT exotropia; ET esotropia; PEK punctate epithelial keratitis; PEE punctate epithelial erosions; DES dry eye syndrome; MGD meibomian gland dysfunction; ATs artificial tears; PFAT's preservative free artificial tears; NSC nuclear sclerotic cataract; PSC posterior subcapsular cataract; ERM epi-retinal membrane; PVD posterior vitreous detachment; RD retinal detachment; DM diabetes mellitus; DR diabetic retinopathy; NPDR non-proliferative diabetic retinopathy; PDR proliferative diabetic retinopathy; CSME clinically significant macular edema; DME diabetic macular edema; dbh dot blot hemorrhages; CWS cotton wool spot; POAG primary open  angle glaucoma; C/D cup-to-disc ratio; HVF humphrey visual field; GVF goldmann visual field; OCT optical coherence tomography; IOP intraocular pressure; BRVO Branch retinal vein occlusion; CRVO central retinal vein occlusion; CRAO central retinal artery occlusion; BRAO branch retinal artery occlusion; RT retinal tear; SB scleral buckle; PPV pars plana vitrectomy; VH Vitreous hemorrhage; PRP panretinal laser photocoagulation; IVK intravitreal kenalog; VMT vitreomacular traction; MH Macular hole;  NVD neovascularization of the disc; NVE neovascularization elsewhere; AREDS age related eye disease study; ARMD age related macular degeneration; POAG primary open angle glaucoma; EBMD epithelial/anterior basement membrane dystrophy; ACIOL anterior chamber intraocular lens; IOL intraocular lens; PCIOL posterior chamber intraocular lens; Phaco/IOL phacoemulsification with intraocular lens placement; [place] photorefractive keratectomy; LASIK laser assisted in situ keratomileusis; HTN hypertension; DM diabetes mellitus; COPD chronic obstructive pulmonary disease

## 2021-05-05 NOTE — Assessment & Plan Note (Addendum)
OD, no new vitreous hemorrhage in rather significant vitreous hemorrhage slowly clearing now over the last 2 months or so.  We will continue to monitor.  Patient is tolerating this gradual in and clearance of the hemorrhage with improving acuity slowly.    We will continue to observe

## 2021-06-30 ENCOUNTER — Other Ambulatory Visit: Payer: Self-pay | Admitting: Family Medicine

## 2021-06-30 DIAGNOSIS — M8580 Other specified disorders of bone density and structure, unspecified site: Secondary | ICD-10-CM

## 2021-07-07 ENCOUNTER — Encounter (INDEPENDENT_AMBULATORY_CARE_PROVIDER_SITE_OTHER): Payer: BC Managed Care – PPO | Admitting: Ophthalmology

## 2021-09-17 ENCOUNTER — Other Ambulatory Visit: Payer: Self-pay

## 2021-09-17 ENCOUNTER — Ambulatory Visit (INDEPENDENT_AMBULATORY_CARE_PROVIDER_SITE_OTHER): Payer: BC Managed Care – PPO | Admitting: Ophthalmology

## 2021-09-17 DIAGNOSIS — H2513 Age-related nuclear cataract, bilateral: Secondary | ICD-10-CM | POA: Diagnosis not present

## 2021-09-17 DIAGNOSIS — H4311 Vitreous hemorrhage, right eye: Secondary | ICD-10-CM | POA: Diagnosis not present

## 2021-09-17 DIAGNOSIS — H35011 Changes in retinal vascular appearance, right eye: Secondary | ICD-10-CM

## 2021-09-17 NOTE — Progress Notes (Signed)
09/17/2021     CHIEF COMPLAINT Patient presents for  Chief Complaint  Patient presents with   Retina Evaluation      HISTORY OF PRESENT ILLNESS: Danielle Alexander is a 65 y.o. female who presents to the clinic today for:   HPI     Retina Evaluation   I, the attending physician,  performed the HPI with the patient and updated documentation appropriately.        Comments   Central vitreous floaters on the right I have continued to slowly clear.  Its now moved to the superior visual field in the right eye.  History of vitreous hemorrhage in the right eye now for some 7 to 39-month      Last edited by Edmon Crape, MD on 09/17/2021  2:35 PM.      Referring physician: Jethro Bolus, MD [address] [place],  [place] [address]  HISTORICAL INFORMATION:   Selected notes from the MEDICAL RECORD NUMBER       CURRENT MEDICATIONS: No current outpatient medications on file. (Ophthalmic Drugs)   No current facility-administered medications for this visit. (Ophthalmic Drugs)   No current outpatient medications on file. (Other)   Current Facility-Administered Medications (Other)  Medication Route   triamcinolone acetonide (KENALOG) 10 MG/ML injection 10 mg Other      REVIEW OF SYSTEMS: ROS   Negative for: Constitutional, Gastrointestinal, Neurological, Skin, Genitourinary, Musculoskeletal, HENT, Endocrine, Cardiovascular, Eyes, Respiratory, Psychiatric, Allergic/Imm, Heme/Lymph Last edited by Edmon Crape, MD on 09/17/2021  2:36 PM.       ALLERGIES No Known Allergies  PAST MEDICAL HISTORY No past medical history on file. No past surgical history on file.  FAMILY HISTORY Family History  Problem Relation Age of Onset   Breast cancer Maternal Aunt 53    SOCIAL HISTORY Social History   Tobacco Use   Smoking status: Unknown   Smokeless tobacco: Never         OPHTHALMIC EXAM:  Base Eye Exam     Visual Acuity (ETDRS)       Right Left    Dist cc 20/60 +1 20/25 +2    Correction: Glasses         Tonometry (Tonopen, 2:37 PM)       Right Left   Pressure 9 11         Pupils       Pupils APD   Right PERRL None   Left PERRL None         Visual Fields       Left Right    Full Full         Neuro/Psych     Oriented x3: Yes   Mood/Affect: Normal         Dilation     Both eyes: 1.0% Mydriacyl, 2.5% Phenylephrine @ 2:37 PM           Slit Lamp and Fundus Exam     External Exam       Right Left   External Normal Normal         Slit Lamp Exam       Right Left   Lids/Lashes Normal Normal   Conjunctiva/Sclera White and quiet White and quiet   Cornea Clear Clear   Anterior Chamber Deep and quiet Deep and quiet   Iris Round and reactive Round and reactive   Lens 2+ Nuclear sclerosis 1+ Nuclear sclerosis   Anterior Vitreous 2+ cells, old white clastic RBCs  Normal         Fundus Exam       Right Left   Posterior Vitreous Minor vitreous debris now settled inferiorly no hemorrhage in the visual axis no new hemorrhage  Normal   Disc Normal Normal   C/D Ratio 0.3 0.35   Macula poor details, attached Normal   Vessels Normal, no visible RAM Normal   Periphery Dense vitreous dispersed hemorrhage, mostly old, no details peripherally, no holes or tears Normal            IMAGING AND PROCEDURES  Imaging and Procedures for 09/17/21  Color Fundus Photography Optos - OU - Both Eyes       Right Eye Progression has improved. Disc findings include normal observations.   Left Eye Progression has no prior data. Disc findings include normal observations. Macula : normal observations. Vessels : normal observations. Periphery : normal observations.   Notes Clearing details posteriorly OD.  Vitreous hemorrhage is now cleared from Prior central visual axis some 7 months previous.               ASSESSMENT/PLAN:  Vitreous hemorrhage of right eye (HCC) OD spontaneous vitreous  hemorrhage continues to clear now as compared to onset some 6 months previous.  Continue to observe.  No inciting event causative of the hole such as a tear or ram has been detected yet.  Excellent acuity will continue to monitor  Cataract, nuclear sclerotic, both eyes Moderate progressive NSC OU.  Follow-up with Dr. Jethro Bolus as scheduled  Retinal macroaneurysm of right eye Note this RAM was suspected by ultrasound examination April 2022 and has never been visualized since then has a hemorrhages cleared slowly     ICD-10-CM   1. Vitreous hemorrhage of right eye (HCC)  H43.11 Color Fundus Photography Optos - OU - Both Eyes    2. Cataract, nuclear sclerotic, both eyes  H25.13     3. 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  OD, spontaneous clearance slowly of vitreous hemorrhage likely from around visualized by B-scan ultrasonography April 2022, inferotemporal quadrant however not visualized clinically to date.  No holes or tears.  Will continue to observe  2.  Bilateral nuclear sclerotic cataract patient deserves to be seen again in 6 months with Dr. Jethro Bolus to monitor this condition.  3.  Ophthalmic Meds Ordered this visit:  No orders of the defined types were placed in this encounter.      No follow-ups on file.  There are no Patient Instructions on file for this visit.   Explained the diagnoses, plan, and follow up with the patient and they expressed understanding.  Patient expressed understanding of the importance of proper follow up care.   Alford Highland Zorawar Strollo M.D. Diseases & Surgery of the Retina and Vitreous [facility] 09/17/21     Abbreviations: M myopia (nearsighted); A astigmatism; H hyperopia (farsighted); P presbyopia; Mrx spectacle prescription;  CTL contact lenses; OD right eye; OS left eye; OU both eyes  XT exotropia; ET esotropia; PEK punctate epithelial keratitis; PEE punctate epithelial erosions; DES dry eye syndrome;  MGD meibomian gland dysfunction; ATs artificial tears; PFAT's preservative free artificial tears; NSC nuclear sclerotic cataract; PSC posterior subcapsular cataract; ERM epi-retinal membrane; PVD posterior vitreous detachment; RD retinal detachment; DM diabetes mellitus; DR diabetic retinopathy; NPDR non-proliferative diabetic retinopathy; PDR proliferative diabetic retinopathy; CSME clinically significant macular edema; DME diabetic macular edema; dbh dot blot hemorrhages; CWS  cotton wool spot; POAG primary open angle glaucoma; C/D cup-to-disc ratio; HVF humphrey visual field; GVF goldmann visual field; OCT optical coherence tomography; IOP intraocular pressure; BRVO Branch retinal vein occlusion; CRVO central retinal vein occlusion; CRAO central retinal artery occlusion; BRAO branch retinal artery occlusion; RT retinal tear; SB scleral buckle; PPV pars plana vitrectomy; VH Vitreous hemorrhage; PRP panretinal laser photocoagulation; IVK intravitreal kenalog; VMT vitreomacular traction; MH Macular hole;  NVD neovascularization of the disc; NVE neovascularization elsewhere; AREDS age related eye disease study; ARMD age related macular degeneration; POAG primary open angle glaucoma; EBMD epithelial/anterior basement membrane dystrophy; ACIOL anterior chamber intraocular lens; IOL intraocular lens; PCIOL posterior chamber intraocular lens; Phaco/IOL phacoemulsification with intraocular lens placement; [place] photorefractive keratectomy; LASIK laser assisted in situ keratomileusis; HTN hypertension; DM diabetes mellitus; COPD chronic obstructive pulmonary disease

## 2021-09-17 NOTE — Assessment & Plan Note (Signed)
Moderate progressive NSC OU.  Follow-up with Dr. Jethro Bolus as scheduled

## 2021-09-17 NOTE — Assessment & Plan Note (Signed)
OD spontaneous vitreous hemorrhage continues to clear now as compared to onset some 6 months previous.  Continue to observe.  No inciting event causative of the hole such as a tear or ram has been detected yet.  Excellent acuity will continue to monitor

## 2021-09-17 NOTE — Assessment & Plan Note (Signed)
Note this RAM was suspected by ultrasound examination April 2022 and has never been visualized since then has a hemorrhages cleared slowly

## 2021-10-31 ENCOUNTER — Emergency Department (HOSPITAL_COMMUNITY)
Admission: EM | Admit: 2021-10-31 | Discharge: 2021-10-31 | Disposition: A | Discharge status: Home / Self Care | Payer: No Typology Code available for payment source | Attending: Emergency Medicine | Admitting: Emergency Medicine

## 2021-10-31 ENCOUNTER — Other Ambulatory Visit: Payer: Self-pay

## 2021-10-31 ENCOUNTER — Encounter (HOSPITAL_COMMUNITY): Payer: Self-pay

## 2021-10-31 ENCOUNTER — Emergency Department (HOSPITAL_COMMUNITY): Payer: No Typology Code available for payment source

## 2021-10-31 DIAGNOSIS — R220 Localized swelling, mass and lump, head: Secondary | ICD-10-CM | POA: Insufficient documentation

## 2021-10-31 DIAGNOSIS — S0993XA Unspecified injury of face, initial encounter: Secondary | ICD-10-CM | POA: Diagnosis present

## 2021-10-31 DIAGNOSIS — S01111A Laceration without foreign body of right eyelid and periocular area, initial encounter: Secondary | ICD-10-CM | POA: Diagnosis not present

## 2021-10-31 DIAGNOSIS — W01198A Fall on same level from slipping, tripping and stumbling with subsequent striking against other object, initial encounter: Secondary | ICD-10-CM | POA: Insufficient documentation

## 2021-10-31 DIAGNOSIS — S0181XA Laceration without foreign body of other part of head, initial encounter: Secondary | ICD-10-CM

## 2021-10-31 DIAGNOSIS — S0083XA Contusion of other part of head, initial encounter: Secondary | ICD-10-CM

## 2021-10-31 DIAGNOSIS — W19XXXA Unspecified fall, initial encounter: Secondary | ICD-10-CM

## 2021-10-31 HISTORY — DX: Other vitreous opacities, right eye: H43.391

## 2021-10-31 NOTE — ED Provider Notes (Signed)
[facility]-EMERGENCY DEPT Provider Note   CSN: [id number] Arrival date & time: 10/31/21  1235     History Chief Complaint  Patient presents with   Danielle Alexander Danielle Alexander is a 65 y.o. female who presents for evaluation after a fall that occurred earlier today.  Patient states that she was walking on a sidewalk when she tripped falling forward and suddenly the front of her face onto the ground.  She is wearing glasses at the time which slammed into the left lateral eyebrow area.  She denies loss of consciousness.  She notes that she definitely tripped, did not have any weakness or vertigo or dizziness that caused her to fall.  Currently she denies headache, vision changes, eye pain and seizures.  Patient is not currently on any daily medications.  Does not take blood thinners.  Has no significant medical history.   Fall Pertinent negatives include no abdominal pain, no headaches and no shortness of breath.      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 No pertinent surgical history.   OB History   No obstetric history on file.     Family History  Problem Relation Age of Onset   Heart attack Mother    Cancer Father    Breast cancer Maternal Aunt 78    Social History   Tobacco Use   Smoking status: Unknown   Smokeless tobacco: Never  Vaping Use   Vaping Use: Never used  Substance Use Topics   Alcohol use: Never   Drug use: Never    Home Medications Prior to Admission medications   Not on File    Allergies    Amoxicillin  Review of Systems   Review of Systems  Constitutional:  Negative for fever.  HENT: Negative.    Eyes: Negative.  Negative for pain and visual disturbance.   Respiratory:  Negative for shortness of breath.   Cardiovascular: Negative.   Gastrointestinal:  Negative for abdominal pain and vomiting.  Endocrine: Negative.   Genitourinary: Negative.   Musculoskeletal: Negative.   Skin:  Positive for wound. Negative for rash.  Neurological:  Negative for headaches.  All other systems reviewed and are negative.  Physical Exam Updated Vital Signs BP (!) 169/91 (BP Location: Right Arm)   Pulse 63   Temp 98.7 F (37.1 C) (Oral)   Resp 18   Ht 5\' 1"  (1.549 m)   Wt 81.6 kg   SpO2 98%   BMI 34.01 kg/m   Physical Exam Vitals and nursing note reviewed.  Constitutional:      General: She is not in acute distress.    Appearance: She is not ill-appearing.  HENT:     Head:      Comments: Swelling and ecchymosis of the lateral eyebrow and upper eyelid.  Superficial approximately 1.5 centimeter laceration with bleeding well controlled Eyes:     Extraocular Movements: Extraocular movements intact.     Conjunctiva/sclera: Conjunctivae normal.     Comments: Swelling to left lateral eyelid.  No hyphema, no injection no redness, no discharge.  EOM intact.  Pupils equal round and reactive to light.  Neck:     Comments: No C-spine tenderness.  No bony deformities, no crepitus.  Full ROM of the neck intact. Cardiovascular:     Rate and Rhythm: Normal rate and regular rhythm.     Pulses: Normal pulses.          Radial pulses are 2+ on the right side and 2+ on the left side.     Heart sounds: No murmur heard. Pulmonary:     Effort: Pulmonary effort is normal. No respiratory distress.     Breath sounds: Normal breath sounds.  Abdominal:     General: Abdomen is flat. There is no distension.     Palpations: Abdomen is soft.     Tenderness: There is no abdominal tenderness.  Musculoskeletal:        General: Normal range of motion.     Cervical back: Normal range of motion.  Skin:    General: Skin is warm and dry.     Capillary Refill: Capillary  refill takes less than 2 seconds.  Neurological:     General: No focal deficit present.     Mental Status: She is alert.     Comments: Speech is clear, able to follow commands CN III-XII intact Normal strength in upper and lower extremities bilaterally including dorsiflexion and plantar flexion, strong and equal grip strength Sensation normal to light and sharp touch Moves extremities without ataxia, coordination intact Normal finger to nose and rapid alternating movements No pronator drift    Psychiatric:        Mood and Affect: Mood normal.    ED Results / Procedures / Treatments   Labs (all labs ordered are listed, but only abnormal results are displayed) Labs Reviewed - No data to display  EKG None  Radiology CT Head Wo Contrast  Result Date: 10/31/2021 CLINICAL DATA:  Facial trauma.  Small laceration above right eye. EXAM: CT HEAD WITHOUT CONTRAST CT CERVICAL SPINE WITHOUT CONTRAST TECHNIQUE: Multidetector CT imaging of the head and cervical spine was performed following the standard protocol without intravenous contrast. Multiplanar CT image reconstructions of the cervical spine were also generated. COMPARISON:  None. FINDINGS: CT HEAD FINDINGS BRAIN: BRAIN Patchy and confluent areas of decreased attenuation are noted throughout the deep and periventricular white matter of the cerebral hemispheres bilaterally, compatible with chronic microvascular ischemic disease. No evidence of large-territorial acute infarction. No parenchymal hemorrhage. No mass lesion. No extra-axial collection. No mass effect or midline shift. No hydrocephalus. Basilar cisterns are patent. Vascular: No hyperdense vessel. Skull: No acute fracture or focal lesion. Sinuses/Orbits: Paranasal sinuses and mastoid air cells are clear. The orbits are unremarkable. Other: Subcutaneus soft tissue edema and small hematoma formation along the right periorbital region. No large hematoma formation no retained radiopaque  foreign body. CT CERVICAL SPINE FINDINGS Alignment: Normal. Skull base and vertebrae: No acute fracture. No aggressive appearing focal osseous lesion or focal pathologic process. Soft tissues and spinal canal: No prevertebral fluid or swelling. No visible canal hematoma. Upper chest: Unremarkable. Other: None. IMPRESSION: 1. No acute intracranial abnormality. 2. No acute displaced fracture or traumatic listhesis of the cervical spine. Electronically Signed   By: Tish Frederickson M.D.   On: 10/31/2021 15:24   CT Cervical Spine Wo Contrast  Result Date: 10/31/2021 CLINICAL DATA:  Facial trauma.  Small laceration above right eye. EXAM: CT HEAD WITHOUT CONTRAST CT CERVICAL SPINE WITHOUT CONTRAST TECHNIQUE: Multidetector CT imaging of the head and cervical spine was performed following the standard protocol without intravenous contrast. Multiplanar CT image reconstructions of the cervical spine were also  generated. COMPARISON:  None. FINDINGS: CT HEAD FINDINGS BRAIN: BRAIN Patchy and confluent areas of decreased attenuation are noted throughout the deep and periventricular white matter of the cerebral hemispheres bilaterally, compatible with chronic microvascular ischemic disease. No evidence of large-territorial acute infarction. No parenchymal hemorrhage. No mass lesion. No extra-axial collection. No mass effect or midline shift. No hydrocephalus. Basilar cisterns are patent. Vascular: No hyperdense vessel. Skull: No acute fracture or focal lesion. Sinuses/Orbits: Paranasal sinuses and mastoid air cells are clear. The orbits are unremarkable. Other: Subcutaneus soft tissue edema and small hematoma formation along the right periorbital region. No large hematoma formation no retained radiopaque foreign body. CT CERVICAL SPINE FINDINGS Alignment: Normal. Skull base and vertebrae: No acute fracture. No aggressive appearing focal osseous lesion or focal pathologic process. Soft tissues and spinal canal: No prevertebral  fluid or swelling. No visible canal hematoma. Upper chest: Unremarkable. Other: None. IMPRESSION: 1. No acute intracranial abnormality. 2. No acute displaced fracture or traumatic listhesis of the cervical spine. Electronically Signed   By: Tish Frederickson M.D.   On: 10/31/2021 15:24    Procedures Procedures   Medications Ordered in ED Medications - No data to display  ED Course  I have reviewed the triage vital signs and the nursing notes.  Pertinent labs & imaging results that were available during my care of the patient were reviewed by me and considered in my medical decision making (see chart for details).    MDM Rules/Calculators/A&P                         This is a 65 year old female presents for evaluation after a ground-level fall that occurred about 2 hours prior to arrival.  Mechanism of injury and symptoms as described above.  Patient had normal neuro exam.  Eye exam normal, with no injection, no abrasions.  EOM intact.  Eyes PERRL.  CT scan of head and neck with no signs of fracture, intracranial bleeding, or acute injury.  Patient's laceration is extremely superficial, with no signs of gaping or need for secondary closure.  The wound was cleaned and irrigated extensively with sterile saline Steri-Strips applied on top.  Given patient's overall reassuring physical exam and negative CT scans, I have less concern for pathology such as intracranial bleed, orbital trauma, or stroke.  Patient discharged home without further intervention needed.  She was educated on alarm symptoms that should indicate her to come back to the ED for evaluation.  She agrees and is amenable to plan. final Clinical Impression(s) / ED Diagnoses Final diagnoses:  Fall, initial encounter  Contusion of face, initial encounter  Facial laceration, initial encounter    Rx / DC Orders ED Discharge Orders     None        Janell Quiet, PA-C 10/31/21 1545    Virgina Norfolk, DO 11/01/21  [phone]

## 2021-10-31 NOTE — Discharge Instructions (Signed)
You are seen today after a fall caused you to fall forward and hit your face on the ground.  Luckily, CT of your head and neck were negative for any signs of intracranial bleeding or fractures.  We cleaned up your wound and put some Steri-Strips on it.  I have attached some instructions here on how to care for your wound on your at home.  All in all good news today, I hope you feel better soon.  Remember to go home and ice your face!

## 2021-10-31 NOTE — ED Triage Notes (Signed)
Per EMS- patient was at work and tripped on the curb. Patient has a small laceration above the right eye. No LOC.

## 2022-01-20 ENCOUNTER — Other Ambulatory Visit: Payer: BC Managed Care – PPO

## 2022-02-02 ENCOUNTER — Ambulatory Visit (INDEPENDENT_AMBULATORY_CARE_PROVIDER_SITE_OTHER): Payer: BC Managed Care – PPO | Admitting: Ophthalmology

## 2022-02-02 ENCOUNTER — Encounter (INDEPENDENT_AMBULATORY_CARE_PROVIDER_SITE_OTHER): Payer: Self-pay | Admitting: Ophthalmology

## 2022-02-02 ENCOUNTER — Other Ambulatory Visit: Payer: Self-pay

## 2022-02-02 DIAGNOSIS — H4311 Vitreous hemorrhage, right eye: Secondary | ICD-10-CM

## 2022-02-02 DIAGNOSIS — H33301 Unspecified retinal break, right eye: Secondary | ICD-10-CM

## 2022-02-02 NOTE — Assessment & Plan Note (Signed)
Vitreous hemorrhage has settled inferiorly likely caused by tractional changes on elevated retinal vessel adjacent to the operculated retinal hole found today superonasal quadrant OD and treated today with retinopexy. ? ?I explained to the patient the floaters could still return because of the ongoing tugging of the vessel but at least the area surrounding this is treated so as to prevent progression to retinal detachment ?

## 2022-02-02 NOTE — Progress Notes (Signed)
02/02/2022     CHIEF COMPLAINT Patient presents for  Chief Complaint  Patient presents with   Posterior Vitreous Detachment With Vitreous Hemorrhage      HISTORY OF PRESENT ILLNESS: Danielle Alexander is a 66 y.o. female who presents to the clinic today for:   HPI   4 month fu ou oct fp. Pt had a fall back in dec of 2022. Pt states" Floaters are back in my OD. I fell back in December 2nd and have noticed floaters back in Jan have came back. But has cleared up a whole lot since Jan"  Pt states vision is the same since last visit.  Last edited by Angeline Slim on 02/02/2022  2:20 PM.      Referring physician: Soundra Pilon, FNP [address] [place],  [place] [address]  HISTORICAL INFORMATION:   Selected notes from the MEDICAL RECORD NUMBER       CURRENT MEDICATIONS: No current outpatient medications on file. (Ophthalmic Drugs)   No current facility-administered medications for this visit. (Ophthalmic Drugs)   No current outpatient medications on file. (Other)   Current Facility-Administered Medications (Other)  Medication Route   triamcinolone acetonide (KENALOG) 10 MG/ML injection 10 mg Other      REVIEW OF SYSTEMS: ROS   Negative for: Constitutional, Gastrointestinal, Neurological, Skin, Genitourinary, Musculoskeletal, HENT, Endocrine, Cardiovascular, Eyes, Respiratory, Psychiatric, Allergic/Imm, Heme/Lymph Last edited by Angeline Slim on 02/02/2022  2:20 PM.       ALLERGIES Allergies  Allergen Reactions   Amoxicillin     PAST MEDICAL HISTORY Past Medical History:  Diagnosis Date   Vitreous floaters of right eye    History reviewed. No pertinent surgical history.  FAMILY HISTORY Family History  Problem Relation Age of Onset   Heart attack Mother    Cancer Father    Breast cancer Maternal Aunt 58    SOCIAL HISTORY Social History   Tobacco Use   Smoking status: Unknown   Smokeless tobacco: Never  Vaping Use   Vaping Use: Never used  Substance Use  Topics   Alcohol use: Never   Drug use: Never         OPHTHALMIC EXAM:  Base Eye Exam     Visual Acuity (ETDRS)       Right Left   Dist cc 20/40 -2 20/20 -2   Dist ph cc 20/25 -2     Correction: Glasses         Tonometry (Tonopen, 2:26 PM)       Right Left   Pressure 8 12         Pupils       Pupils Dark Light Shape React APD   Right PERRL 3 2 Round Brisk None   Left PERRL 3 2 Round Brisk None         Visual Fields       Left Right    Full Full         Extraocular Movement       Right Left    Full Full         Neuro/Psych     Oriented x3: Yes   Mood/Affect: Normal         Dilation     Both eyes: 1.0% Mydriacyl, 2.5% Phenylephrine @ 2:24 PM           Slit Lamp and Fundus Exam     External Exam       Right Left   External  Normal Normal         Slit Lamp Exam       Right Left   Lids/Lashes Normal Normal   Conjunctiva/Sclera White and quiet White and quiet   Cornea Clear Clear   Anterior Chamber Deep and quiet Deep and quiet   Iris Round and reactive Round and reactive   Lens 2+ Nuclear sclerosis 1+ Nuclear sclerosis   Anterior Vitreous 2+ cells, old white clastic RBCs Normal         Fundus Exam       Right Left   Posterior Vitreous Minor vitreous debris now settled inferiorly no hemorrhage in the visual axis no new hemorrhage  Normal   Disc Normal Normal   C/D Ratio 0.3 0.35   Macula poor details, attached Normal   Vessels Normal, no visible RAM, vitreal retinal traction in the periphery with elevated retinal vein, likely site of recurrent hemorrhage inferotemporal Normal   Periphery Old, mild mostly inferior vitreous dispersed hemorrhage, mostly old, no details peripherally, holes or hole at 2, vitreal traction on adjacent vessel., Horseshoe tear, Chorioretinal scar, Vitreous traction into the mid visit on operculated hole with direct traction on an elevated vessel Normal            IMAGING AND PROCEDURES   Imaging and Procedures for 02/02/22  OCT, Retina - OU - Both Eyes       Right Eye Quality was good. Scan locations included subfoveal. Central Foveal Thickness: 256. Progression has no prior data. Findings include normal foveal contour.   Left Eye Quality was good. Scan locations included subfoveal. Central Foveal Thickness: 258. Findings include normal foveal contour.   Notes Moderate vitreous debris OD     Color Fundus Photography Optos - OU - Both Eyes       Right Eye Progression has improved. Disc findings include normal observations.   Left Eye Progression has no prior data. Disc findings include normal observations. Macula : normal observations. Vessels : normal observations. Periphery : normal observations.   Notes Clearing details posteriorly OD.  Vitreous hemorrhage is now cleared from Prior central visual axis  Superonasal near equator, operculated retinal hole with vitreal traction on elevated vessel likely because of recurrent hemorrhages, OD     Repair Retinal Breaks, Laser - OD - Right Eye       Tear locations include superior, nasal.   Time Out Confirmed correct patient, procedure, site, and patient consented.   Anesthesia Topical anesthesia was used. Anesthetic medications included Proparacaine 0.5%.   Laser Information The type of laser was diode. Color was yellow. The duration in seconds was 0.03. The spot size was 390 microns. Laser power was 260. Total spots was 105.   Post-op The patient tolerated the procedure well. There were no complications. The patient received written and verbal post procedure care education.              ASSESSMENT/PLAN:  Retinal hole or tear, right Laser retinopexy today, superonasal quadrant, region with ongoing vitreal retinal traction with operculated retinal hole as well as vitreal retinal traction and elevated vessel out of the retina triggering hemorrhage.  Vitreous hemorrhage of right eye  (HCC) Vitreous hemorrhage has settled inferiorly likely caused by tractional changes on elevated retinal vessel adjacent to the operculated retinal hole found today superonasal quadrant OD and treated today with retinopexy.  I explained to the patient the floaters could still return because of the ongoing tugging of the vessel but at least the area surrounding  this is treated so as to prevent progression to retinal detachment     ICD-10-CM   1. Retinal hole or tear, right  H33.301 Repair Retinal Breaks, Laser - OD - Right Eye    2. Vitreous hemorrhage of right eye (HCC)  H43.11 OCT, Retina - OU - Both Eyes    Color Fundus Photography Optos - OU - Both Eyes      1.  Laser retinopexy today around superonasal operculated tear with adjacent retinal elevation and elevated retinal vessel secondary to vitreous traction  2.  3.  Ophthalmic Meds Ordered this visit:  No orders of the defined types were placed in this encounter.      Return in about 4 months (around 06/04/2022) for DILATE OU, COLOR FP.  There are no Patient Instructions on file for this visit.   Explained the diagnoses, plan, and follow up with the patient and they expressed understanding.  Patient expressed understanding of the importance of proper follow up care.   Alford Highland Jourdin Gens M.D. Diseases & Surgery of the Retina and Vitreous [facility] 02/02/22     Abbreviations: M myopia (nearsighted); A astigmatism; H hyperopia (farsighted); P presbyopia; Mrx spectacle prescription;  CTL contact lenses; OD right eye; OS left eye; OU both eyes  XT exotropia; ET esotropia; PEK punctate epithelial keratitis; PEE punctate epithelial erosions; DES dry eye syndrome; MGD meibomian gland dysfunction; ATs artificial tears; PFAT's preservative free artificial tears; NSC nuclear sclerotic cataract; PSC posterior subcapsular cataract; ERM epi-retinal membrane; PVD posterior vitreous detachment; RD retinal detachment; DM  diabetes mellitus; DR diabetic retinopathy; NPDR non-proliferative diabetic retinopathy; PDR proliferative diabetic retinopathy; CSME clinically significant macular edema; DME diabetic macular edema; dbh dot blot hemorrhages; CWS cotton wool spot; POAG primary open angle glaucoma; C/D cup-to-disc ratio; HVF humphrey visual field; GVF goldmann visual field; OCT optical coherence tomography; IOP intraocular pressure; BRVO Branch retinal vein occlusion; CRVO central retinal vein occlusion; CRAO central retinal artery occlusion; BRAO branch retinal artery occlusion; RT retinal tear; SB scleral buckle; PPV pars plana vitrectomy; VH Vitreous hemorrhage; PRP panretinal laser photocoagulation; IVK intravitreal kenalog; VMT vitreomacular traction; MH Macular hole;  NVD neovascularization of the disc; NVE neovascularization elsewhere; AREDS age related eye disease study; ARMD age related macular degeneration; POAG primary open angle glaucoma; EBMD epithelial/anterior basement membrane dystrophy; ACIOL anterior chamber intraocular lens; IOL intraocular lens; PCIOL posterior chamber intraocular lens; Phaco/IOL phacoemulsification with intraocular lens placement; PRK photorefractive keratectomy; LASIK laser assisted in situ keratomileusis; HTN hypertension; DM diabetes mellitus; COPD chronic obstructive pulmonary disease

## 2022-02-02 NOTE — Assessment & Plan Note (Signed)
Laser retinopexy today, superonasal quadrant, region with ongoing vitreal retinal traction with operculated retinal hole as well as vitreal retinal traction and elevated vessel out of the retina triggering hemorrhage. ?

## 2022-06-04 ENCOUNTER — Ambulatory Visit (INDEPENDENT_AMBULATORY_CARE_PROVIDER_SITE_OTHER): Payer: Medicare PPO | Admitting: Ophthalmology

## 2022-06-04 ENCOUNTER — Encounter (INDEPENDENT_AMBULATORY_CARE_PROVIDER_SITE_OTHER): Payer: Self-pay | Admitting: Ophthalmology

## 2022-06-04 DIAGNOSIS — H2513 Age-related nuclear cataract, bilateral: Secondary | ICD-10-CM | POA: Diagnosis not present

## 2022-06-04 DIAGNOSIS — H33301 Unspecified retinal break, right eye: Secondary | ICD-10-CM

## 2022-06-04 DIAGNOSIS — H4311 Vitreous hemorrhage, right eye: Secondary | ICD-10-CM | POA: Diagnosis not present

## 2022-06-04 NOTE — Assessment & Plan Note (Signed)
Good laser retinopexy around vitreous traction retinal breaks superonasal nasal right eye 2:00 meridian.  Stable.  Vitreous hemorrhage associated with this is continue to clear.  Patient doing well

## 2022-06-04 NOTE — Assessment & Plan Note (Signed)
Clearing

## 2022-06-04 NOTE — Assessment & Plan Note (Signed)
Likely progression of visually significant cataract follow-up Dr. Nile Riggs as scheduled

## 2022-06-04 NOTE — Progress Notes (Signed)
06/04/2022     CHIEF COMPLAINT Patient presents for No chief complaint on file.     HISTORY OF PRESENT ILLNESS: Danielle Alexander is a 66 y.o. female who presents to the clinic today for:   HPI   4 month, Dilate OU, COLOR FP Pt states her vision has been stable Pt denies any new floaters or FOL Pt states " I believe my vision will stay the same and not clear up"  Last edited by Aleene Davidson, CMA on 06/04/2022  2:01 PM.      Referring physician: Jethro Bolus, MD [address] [place],  [place] [address]  HISTORICAL INFORMATION:   Selected notes from the MEDICAL RECORD NUMBER       CURRENT MEDICATIONS: No current outpatient medications on file. (Ophthalmic Drugs)   No current facility-administered medications for this visit. (Ophthalmic Drugs)   No current outpatient medications on file. (Other)   Current Facility-Administered Medications (Other)  Medication Route   triamcinolone acetonide (KENALOG) 10 MG/ML injection 10 mg Other      REVIEW OF SYSTEMS: ROS   Negative for: Constitutional, Gastrointestinal, Neurological, Skin, Genitourinary, Musculoskeletal, HENT, Endocrine, Cardiovascular, Eyes, Respiratory, Psychiatric, Allergic/Imm, Heme/Lymph Last edited by Aleene Davidson, CMA on 06/04/2022  2:01 PM.       ALLERGIES Allergies  Allergen Reactions   Amoxicillin     PAST MEDICAL HISTORY Past Medical History:  Diagnosis Date   Vitreous floaters of right eye    No past surgical history on file.  FAMILY HISTORY Family History  Problem Relation Age of Onset   Heart attack Mother    Cancer Father    Breast cancer Maternal Aunt 19    SOCIAL HISTORY Social History   Tobacco Use   Smoking status: Unknown   Smokeless tobacco: Never  Vaping Use   Vaping Use: Never used  Substance Use Topics   Alcohol use: Never   Drug use: Never         OPHTHALMIC EXAM:  Base Eye Exam     Visual Acuity (Snellen - Linear)       Right Left    Dist [place] 20/100 20/25   Dist cc  20/20         Tonometry (Tonopen, 2:07 PM)       Right Left   Pressure 11 13         Neuro/Psych     Oriented x3: Yes   Mood/Affect: Normal         Dilation     Both eyes: 1.0% Mydriacyl, 2.5% Phenylephrine @ 2:07 PM           Slit Lamp and Fundus Exam     External Exam       Right Left   External Normal Normal         Slit Lamp Exam       Right Left   Lids/Lashes Normal Normal   Conjunctiva/Sclera White and quiet White and quiet   Cornea Clear Clear   Anterior Chamber Deep and quiet Deep and quiet   Iris Round and reactive Round and reactive   Lens 2+ Nuclear sclerosis 1+ Nuclear sclerosis   Anterior Vitreous 2+ cells, old white clastic RBCs Normal         Fundus Exam       Right Left   Posterior Vitreous Minor vitreous debris now settled inferiorly no hemorrhage in the visual axis no new hemorrhage  Normal   Disc Normal Normal  C/D Ratio 0.3 0.35   Macula poor details, attached Normal   Vessels Normal, no visible RAM, vitreal retinal traction in the periphery with elevated retinal vein, likely site of recurrent hemorrhage inferotemporal Normal   Periphery Old, mild mostly inferior vitreous dispersed hemorrhage, mostly old, no details peripherally, holes or hole at 2, vitreal traction on adjacent vessel., Horseshoe tear, Chorioretinal scar, Vitreous traction into the mid visit on operculated hole with direct traction on an elevated vessel, Retinopexy Normal            IMAGING AND PROCEDURES  Imaging and Procedures for 06/04/22  Color Fundus Photography Optos - OU - Both Eyes       Right Eye Progression has improved. Disc findings include normal observations.   Left Eye Progression has no prior data. Disc findings include normal observations. Macula : normal observations. Vessels : normal observations. Periphery : normal observations.   Notes Clearing details posteriorly OD.  Vitreous hemorrhage is  now cleared from Prior central visual axis  Superonasal near equator, operculated retinal hole with vitreal traction on elevated vessel likely because of recurrent hemorrhages, OD now with good retinopexy.              ASSESSMENT/PLAN:  Retinal hole or tear, right Good laser retinopexy around vitreous traction retinal breaks superonasal nasal right eye 2:00 meridian.  Stable.  Vitreous hemorrhage associated with this is continue to clear.  Patient doing well  Cataract, nuclear sclerotic, both eyes Likely progression of visually significant cataract follow-up Dr. Nile Riggs as scheduled  Vitreous hemorrhage of right eye (HCC) Clearing     ICD-10-CM   1. Retinal hole or tear, right  H33.301 Color Fundus Photography Optos - OU - Both Eyes    2. Cataract, nuclear sclerotic, both eyes  H25.13     3. Vitreous hemorrhage of right eye (HCC)  H43.11       1.  Retinal tear right eye well treated.  No new breaks, associated vitreous hemorrhage continues to clear  2.  Close chronic cataract OU I believe visually significant as patient is having difficulty in low light situations. SunGlasses analogy reviewed with the patient.  She agrees  3.  Follow-up with Dr. Jethro Bolus  Ophthalmic Meds Ordered this visit:  No orders of the defined types were placed in this encounter.      Return in about 6 months (around 12/05/2022) for DILATE OU, COLOR FP, OCT.  There are no Patient Instructions on file for this visit.   Explained the diagnoses, plan, and follow up with the patient and they expressed understanding.  Patient expressed understanding of the importance of proper follow up care.   Alford Highland Andray Assefa M.D. Diseases & Surgery of the Retina and Vitreous [facility] 06/04/22     Abbreviations: M myopia (nearsighted); A astigmatism; H hyperopia (farsighted); P presbyopia; Mrx spectacle prescription;  CTL contact lenses; OD right eye; OS left eye; OU both eyes  XT  exotropia; ET esotropia; PEK punctate epithelial keratitis; PEE punctate epithelial erosions; DES dry eye syndrome; MGD meibomian gland dysfunction; ATs artificial tears; PFAT's preservative free artificial tears; NSC nuclear sclerotic cataract; PSC posterior subcapsular cataract; ERM epi-retinal membrane; PVD posterior vitreous detachment; RD retinal detachment; DM diabetes mellitus; DR diabetic retinopathy; NPDR non-proliferative diabetic retinopathy; PDR proliferative diabetic retinopathy; CSME clinically significant macular edema; DME diabetic macular edema; dbh dot blot hemorrhages; CWS cotton wool spot; POAG primary open angle glaucoma; C/D cup-to-disc ratio; HVF humphrey visual field; GVF goldmann visual  field; OCT optical coherence tomography; IOP intraocular pressure; BRVO Branch retinal vein occlusion; CRVO central retinal vein occlusion; CRAO central retinal artery occlusion; BRAO branch retinal artery occlusion; RT retinal tear; SB scleral buckle; PPV pars plana vitrectomy; VH Vitreous hemorrhage; PRP panretinal laser photocoagulation; IVK intravitreal kenalog; VMT vitreomacular traction; MH Macular hole;  NVD neovascularization of the disc; NVE neovascularization elsewhere; AREDS age related eye disease study; ARMD age related macular degeneration; POAG primary open angle glaucoma; EBMD epithelial/anterior basement membrane dystrophy; ACIOL anterior chamber intraocular lens; IOL intraocular lens; PCIOL posterior chamber intraocular lens; Phaco/IOL phacoemulsification with intraocular lens placement; [place] photorefractive keratectomy; LASIK laser assisted in situ keratomileusis; HTN hypertension; DM diabetes mellitus; COPD chronic obstructive pulmonary disease

## 2022-06-17 ENCOUNTER — Ambulatory Visit
Admission: RE | Admit: 2022-06-17 | Discharge: 2022-06-17 | Disposition: A | Discharge status: Home / Self Care | Payer: Medicare PPO | Source: Ambulatory Visit | Attending: Family Medicine | Admitting: Family Medicine

## 2022-06-17 DIAGNOSIS — M8580 Other specified disorders of bone density and structure, unspecified site: Secondary | ICD-10-CM

## 2022-09-17 ENCOUNTER — Encounter (INDEPENDENT_AMBULATORY_CARE_PROVIDER_SITE_OTHER): Payer: BC Managed Care – PPO | Admitting: Ophthalmology

## 2022-12-07 ENCOUNTER — Encounter (INDEPENDENT_AMBULATORY_CARE_PROVIDER_SITE_OTHER): Payer: Medicare PPO | Admitting: Ophthalmology

## 2023-05-17 ENCOUNTER — Other Ambulatory Visit: Payer: Self-pay | Admitting: Family Medicine

## 2023-05-17 DIAGNOSIS — Z1231 Encounter for screening mammogram for malignant neoplasm of breast: Secondary | ICD-10-CM

## 2023-05-18 DIAGNOSIS — H5213 Myopia, bilateral: Secondary | ICD-10-CM | POA: Diagnosis not present

## 2023-05-18 DIAGNOSIS — H524 Presbyopia: Secondary | ICD-10-CM | POA: Diagnosis not present

## 2023-05-18 DIAGNOSIS — H2513 Age-related nuclear cataract, bilateral: Secondary | ICD-10-CM | POA: Diagnosis not present

## 2023-05-18 DIAGNOSIS — Z135 Encounter for screening for eye and ear disorders: Secondary | ICD-10-CM | POA: Diagnosis not present

## 2023-05-18 DIAGNOSIS — H52223 Regular astigmatism, bilateral: Secondary | ICD-10-CM | POA: Diagnosis not present

## 2023-05-19 ENCOUNTER — Ambulatory Visit
Admission: RE | Admit: 2023-05-19 | Discharge: 2023-05-19 | Disposition: A | Discharge status: Home / Self Care | Payer: Medicare PPO | Source: Ambulatory Visit | Attending: Family Medicine | Admitting: Family Medicine

## 2023-05-19 DIAGNOSIS — Z1231 Encounter for screening mammogram for malignant neoplasm of breast: Secondary | ICD-10-CM | POA: Diagnosis not present

## 2023-07-19 DIAGNOSIS — Z6837 Body mass index (BMI) 37.0-37.9, adult: Secondary | ICD-10-CM | POA: Diagnosis not present

## 2023-07-19 DIAGNOSIS — Z0000 Encounter for general adult medical examination without abnormal findings: Secondary | ICD-10-CM | POA: Diagnosis not present

## 2023-07-19 DIAGNOSIS — Z1211 Encounter for screening for malignant neoplasm of colon: Secondary | ICD-10-CM | POA: Diagnosis not present

## 2023-07-19 DIAGNOSIS — M8580 Other specified disorders of bone density and structure, unspecified site: Secondary | ICD-10-CM | POA: Diagnosis not present

## 2023-07-19 DIAGNOSIS — R7303 Prediabetes: Secondary | ICD-10-CM | POA: Diagnosis not present

## 2023-07-19 DIAGNOSIS — E782 Mixed hyperlipidemia: Secondary | ICD-10-CM | POA: Diagnosis not present

## 2023-10-05 IMAGING — CT CT CERVICAL SPINE W/O CM
3 of 4 series · 12 of 33 positions shown, 14 images · non-contrast
Comparison: None.

CLINICAL DATA: Facial trauma.  Small laceration above right eye.

EXAM:
CT HEAD WITHOUT CONTRAST
CT CERVICAL SPINE WITHOUT CONTRAST
TECHNIQUE: Multidetector CT imaging of the head and cervical spine was
performed following the standard protocol without intravenous
contrast. Multiplanar CT image reconstructions of the cervical spine
were also generated.

[Series 5: orthogonal bone · axial · 0.23mm/px · z∈[-299,-194]mm · 4 of 86 slices shown, 5 images]
[im 15/86  soft-tissue]
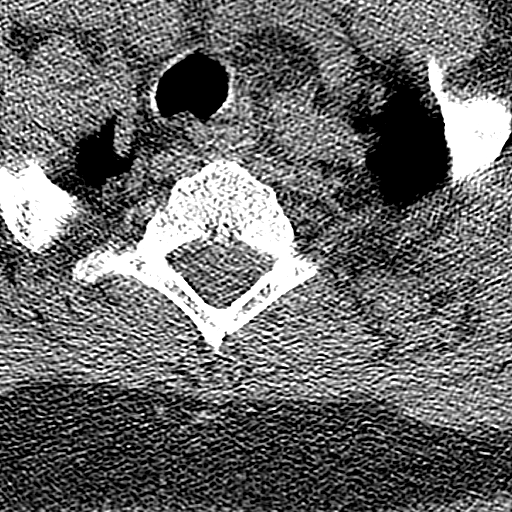
[im 15/86  bone]
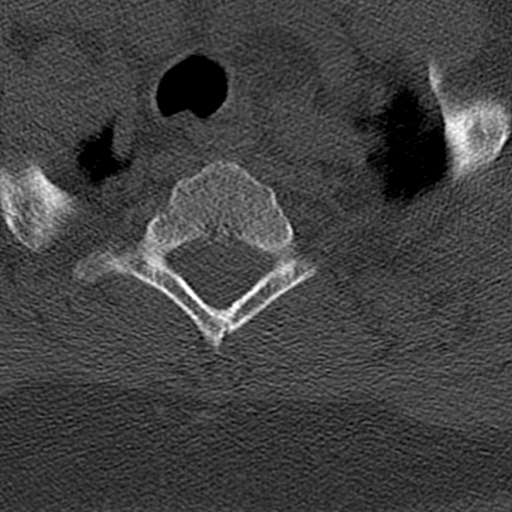
[im 29/86  bone]
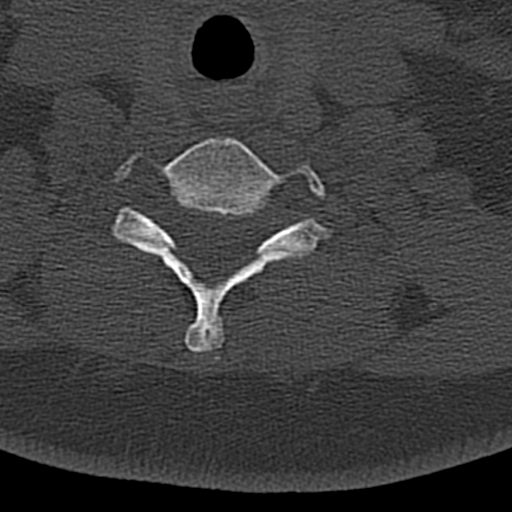
[im 57/86  bone]
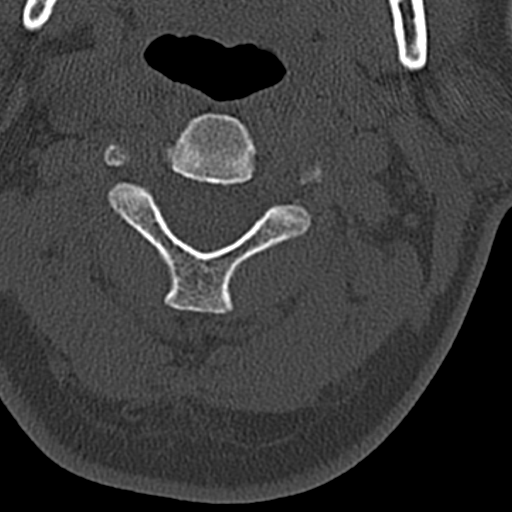
[im 71/86  bone]
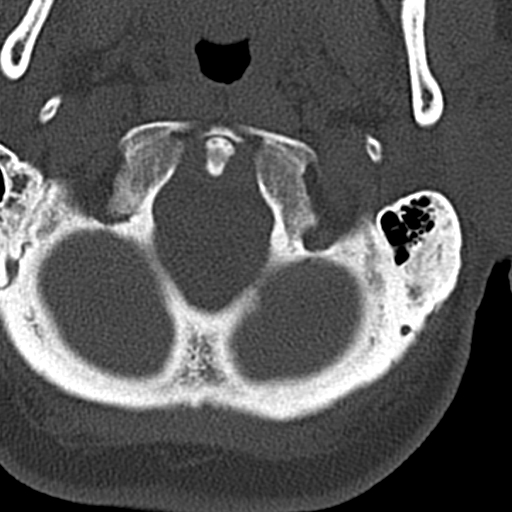

[Series 6: coronal bone · coronal · 0.23mm/px · 3 of 61 slices shown]
[im 13/61  bone]
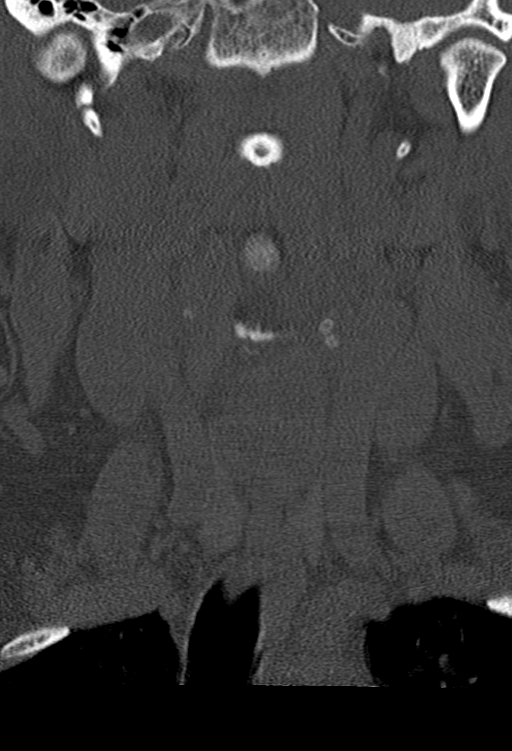
[im 25/61  bone]
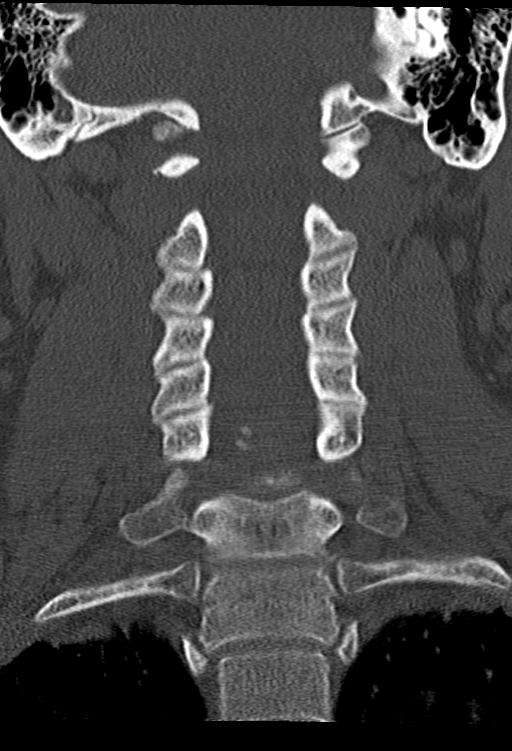
[im 37/61  bone]
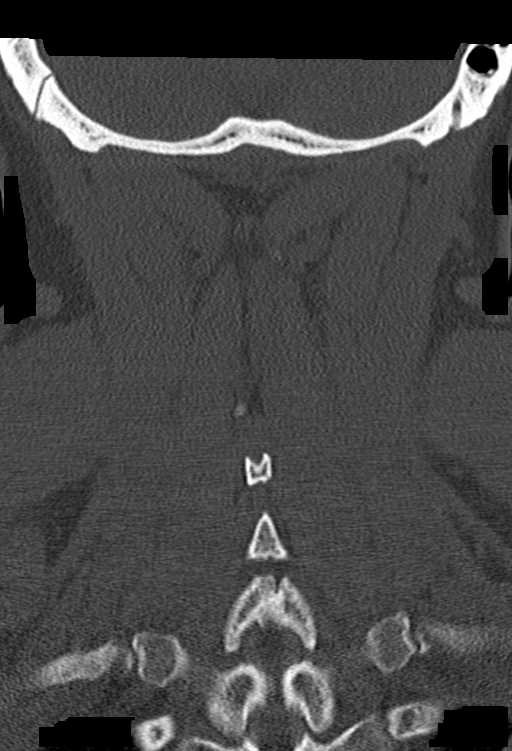

[Series 7: sagittal bone · sagittal · 0.24mm/px · 5 of 61 slices shown, 6 images]
[im 21/61  bone]
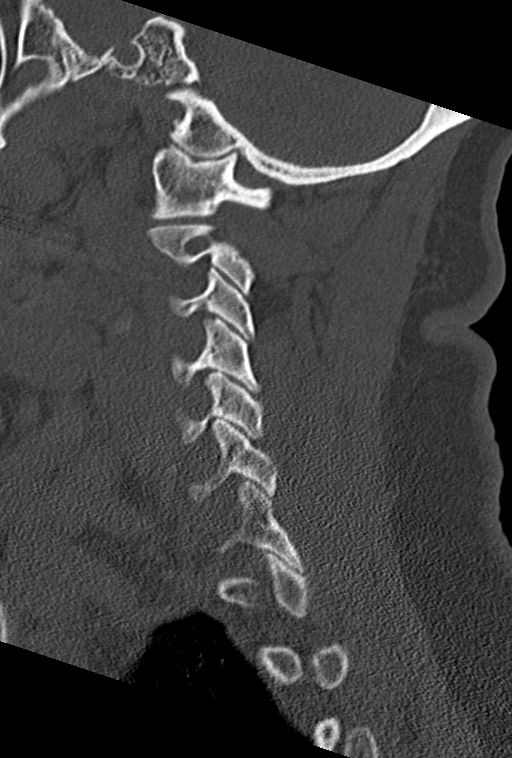
[im 26/61  bone]
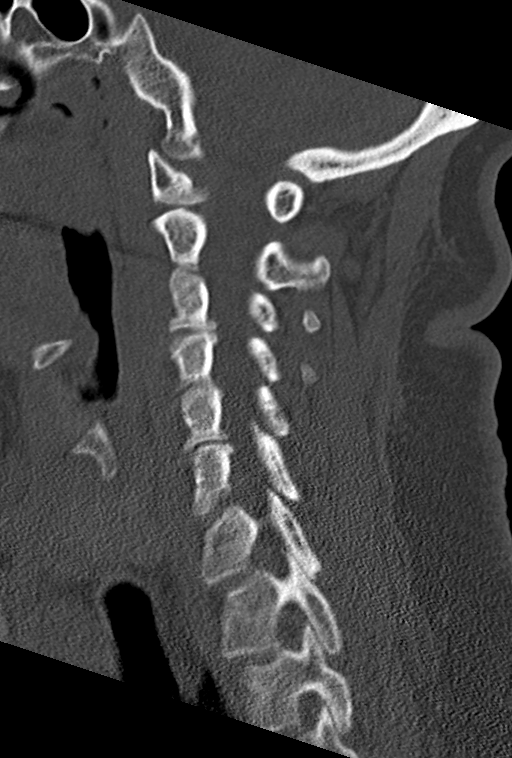
[im 31/61  soft-tissue]
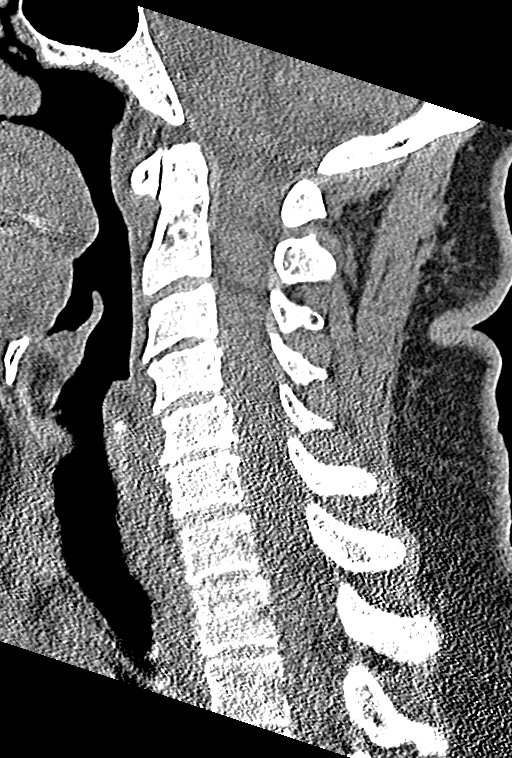
[im 31/61  bone]
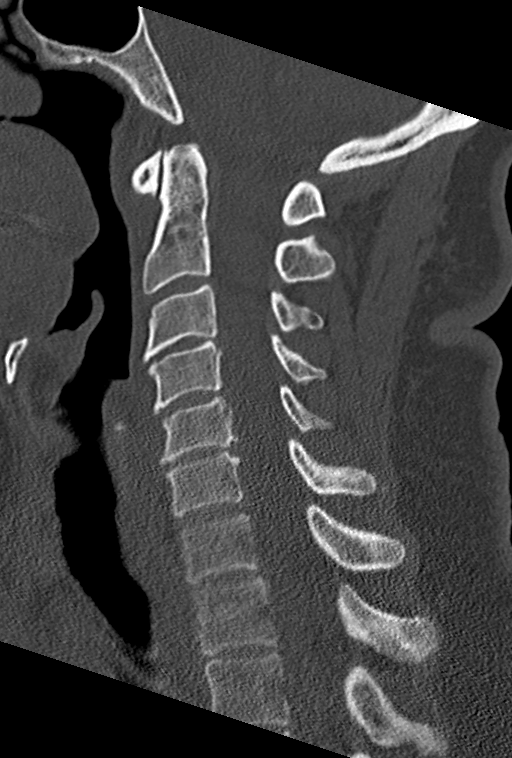
[im 36/61  bone]
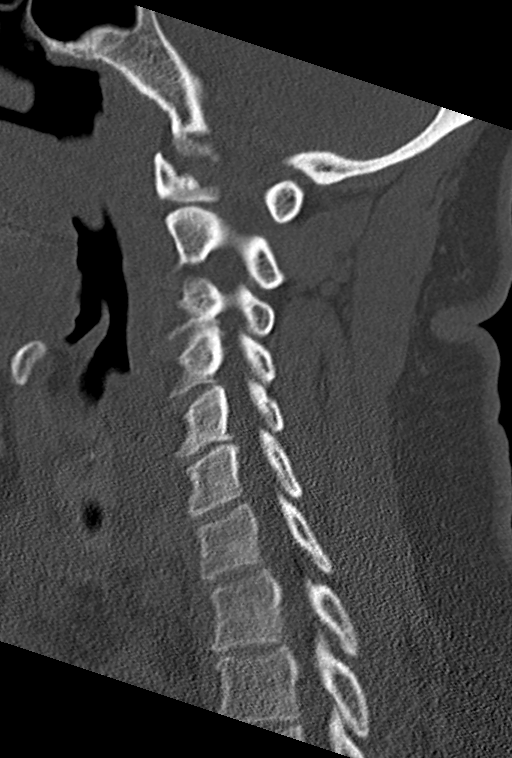
[im 41/61  bone]
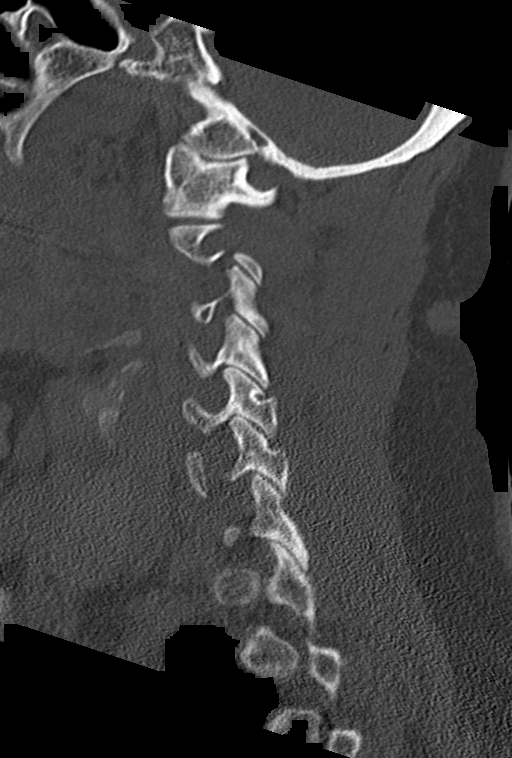

[12 of 33 positions shown; findings below may reference images not displayed]

FINDINGS: CT HEAD FINDINGS

BRAIN:
BRAIN
Patchy and confluent areas of decreased attenuation are noted
throughout the deep and periventricular white matter of the cerebral
hemispheres bilaterally, compatible with chronic microvascular
ischemic disease.

No evidence of large-territorial acute infarction. No parenchymal
hemorrhage. No mass lesion. No extra-axial collection.

No mass effect or midline shift. No hydrocephalus. Basilar cisterns
are patent.

Vascular: No hyperdense vessel.

Skull: No acute fracture or focal lesion.

Sinuses/Orbits: Paranasal sinuses and mastoid air cells are clear.
The orbits are unremarkable.

Other: Subcutaneus soft tissue edema and small hematoma formation
along the right periorbital region. No large hematoma formation no
retained radiopaque foreign body.

CT CERVICAL SPINE FINDINGS

Alignment: Normal.

Skull base and vertebrae: No acute fracture. No aggressive appearing
focal osseous lesion or focal pathologic process.

Soft tissues and spinal canal: No prevertebral fluid or swelling. No
visible canal hematoma.

Upper chest: Unremarkable.

Other: None.
IMPRESSION: 1. No acute intracranial abnormality.
2. No acute displaced fracture or traumatic listhesis of the
cervical spine.

## 2024-02-23 DIAGNOSIS — M79642 Pain in left hand: Secondary | ICD-10-CM | POA: Diagnosis not present

## 2024-02-23 DIAGNOSIS — Z6837 Body mass index (BMI) 37.0-37.9, adult: Secondary | ICD-10-CM | POA: Diagnosis not present

## 2024-02-25 DIAGNOSIS — M79645 Pain in left finger(s): Secondary | ICD-10-CM | POA: Diagnosis not present

## 2024-04-27 ENCOUNTER — Other Ambulatory Visit: Payer: Self-pay | Admitting: Family Medicine

## 2024-04-27 DIAGNOSIS — Z1231 Encounter for screening mammogram for malignant neoplasm of breast: Secondary | ICD-10-CM

## 2024-05-25 ENCOUNTER — Ambulatory Visit
Admission: RE | Admit: 2024-05-25 | Discharge: 2024-05-25 | Disposition: A | Discharge status: Home / Self Care | Source: Ambulatory Visit | Attending: Family Medicine | Admitting: Family Medicine

## 2024-05-25 DIAGNOSIS — Z1231 Encounter for screening mammogram for malignant neoplasm of breast: Secondary | ICD-10-CM

## 2024-05-30 DIAGNOSIS — H2513 Age-related nuclear cataract, bilateral: Secondary | ICD-10-CM | POA: Diagnosis not present

## 2024-07-21 DIAGNOSIS — E669 Obesity, unspecified: Secondary | ICD-10-CM | POA: Diagnosis not present

## 2024-07-21 DIAGNOSIS — Z0000 Encounter for general adult medical examination without abnormal findings: Secondary | ICD-10-CM | POA: Diagnosis not present

## 2024-07-21 DIAGNOSIS — R7303 Prediabetes: Secondary | ICD-10-CM | POA: Diagnosis not present

## 2024-07-21 DIAGNOSIS — E782 Mixed hyperlipidemia: Secondary | ICD-10-CM | POA: Diagnosis not present

## 2024-07-21 DIAGNOSIS — E559 Vitamin D deficiency, unspecified: Secondary | ICD-10-CM | POA: Diagnosis not present

## 2024-07-21 DIAGNOSIS — M8580 Other specified disorders of bone density and structure, unspecified site: Secondary | ICD-10-CM | POA: Diagnosis not present
